# Patient Record
Sex: Male | Born: 1970 | Race: White | Hispanic: No | Marital: Married | Smoking: Former smoker
Health system: Southern US, Community
[De-identification: ages and names within clinical notes are randomized; demographics above are authoritative.]

## PROBLEM LIST (undated history)

## (undated) DIAGNOSIS — K859 Acute pancreatitis without necrosis or infection, unspecified: Secondary | ICD-10-CM

## (undated) DIAGNOSIS — E119 Type 2 diabetes mellitus without complications: Secondary | ICD-10-CM

## (undated) HISTORY — PX: CHOLECYSTECTOMY: SHX55

---

## 2008-12-27 ENCOUNTER — Emergency Department (HOSPITAL_COMMUNITY): Admission: EM | Admit: 2008-12-27 | Discharge: 2008-12-27 | Payer: Self-pay | Admitting: Emergency Medicine

## 2010-05-10 ENCOUNTER — Emergency Department (HOSPITAL_COMMUNITY): Admission: EM | Admit: 2010-05-10 | Discharge: 2010-05-11 | Payer: Self-pay | Admitting: Emergency Medicine

## 2010-11-11 LAB — URINALYSIS, ROUTINE W REFLEX MICROSCOPIC
Glucose, UA: NEGATIVE mg/dL
Nitrite: POSITIVE — AB
Protein, ur: 100 mg/dL — AB
Urobilinogen, UA: 0.2 mg/dL (ref 0.0–1.0)

## 2010-11-11 LAB — DIFFERENTIAL
Basophils Absolute: 0 10*3/uL (ref 0.0–0.1)
Eosinophils Absolute: 0.2 10*3/uL (ref 0.0–0.7)
Lymphocytes Relative: 16 % (ref 12–46)
Lymphs Abs: 1.4 10*3/uL (ref 0.7–4.0)
Neutrophils Relative %: 74 % (ref 43–77)

## 2010-11-11 LAB — BASIC METABOLIC PANEL
BUN: 9 mg/dL (ref 6–23)
Calcium: 8.9 mg/dL (ref 8.4–10.5)
Creatinine, Ser: 1.06 mg/dL (ref 0.4–1.5)
GFR calc Af Amer: >60 mL/min (ref >60)

## 2010-11-11 LAB — URINE CULTURE: Colony Count: >=100000

## 2010-11-11 LAB — URINE MICROSCOPIC-ADD ON

## 2010-11-11 LAB — CBC
Platelets: 292 10*3/uL (ref 150–400)
RBC: 4.8 MIL/uL (ref 4.22–5.81)
RDW: 14.5 % (ref 11.5–15.5)
WBC: 8.9 10*3/uL (ref 4.0–10.5)

## 2010-12-07 LAB — URINALYSIS, ROUTINE W REFLEX MICROSCOPIC
Glucose, UA: 100 mg/dL — AB
Ketones, ur: NEGATIVE mg/dL
Leukocytes, UA: NEGATIVE
Nitrite: NEGATIVE
Protein, ur: NEGATIVE mg/dL
pH: 5 (ref 5.0–8.0)

## 2010-12-07 LAB — CBC
MCHC: 34.3 g/dL (ref 30.0–36.0)
MCV: 86.3 fL (ref 78.0–100.0)
Platelets: 282 10*3/uL (ref 150–400)
RBC: 4.93 MIL/uL (ref 4.22–5.81)
RDW: 14.2 % (ref 11.5–15.5)

## 2010-12-07 LAB — URINE MICROSCOPIC-ADD ON

## 2010-12-07 LAB — DIFFERENTIAL
Basophils Absolute: 0 10*3/uL (ref 0.0–0.1)
Basophils Relative: 0 % (ref 0–1)
Eosinophils Absolute: 0.2 10*3/uL (ref 0.0–0.7)
Monocytes Relative: 8 % (ref 3–12)
Neutro Abs: 4.6 10*3/uL (ref 1.7–7.7)
Neutrophils Relative %: 64 % (ref 43–77)

## 2010-12-07 LAB — URINE CULTURE: Colony Count: 30000

## 2010-12-07 LAB — BASIC METABOLIC PANEL
BUN: 8 mg/dL (ref 6–23)
CO2: 22 mEq/L (ref 19–32)
Calcium: 8.8 mg/dL (ref 8.4–10.5)
Chloride: 108 mEq/L (ref 96–112)
Creatinine, Ser: 0.93 mg/dL (ref 0.4–1.5)
GFR calc Af Amer: >60 mL/min (ref >60)

## 2015-11-17 ENCOUNTER — Emergency Department (HOSPITAL_COMMUNITY)
Admission: EM | Admit: 2015-11-17 | Discharge: 2015-11-17 | Disposition: A | Discharge status: Home / Self Care | Payer: BLUE CROSS/BLUE SHIELD | Attending: Emergency Medicine | Admitting: Emergency Medicine

## 2015-11-17 ENCOUNTER — Encounter (HOSPITAL_COMMUNITY): Payer: Self-pay

## 2015-11-17 DIAGNOSIS — R509 Fever, unspecified: Secondary | ICD-10-CM | POA: Diagnosis present

## 2015-11-17 DIAGNOSIS — F17290 Nicotine dependence, other tobacco product, uncomplicated: Secondary | ICD-10-CM | POA: Insufficient documentation

## 2015-11-17 DIAGNOSIS — J111 Influenza due to unidentified influenza virus with other respiratory manifestations: Secondary | ICD-10-CM | POA: Diagnosis not present

## 2015-11-17 MED ORDER — IBUPROFEN 800 MG PO TABS
800.0000 mg | ORAL_TABLET | Freq: Once | ORAL | Status: AC
  Administered 2015-11-17: 800 mg via ORAL
  Filled 2015-11-17: qty 1

## 2015-11-17 NOTE — Discharge Instructions (Signed)

## 2015-11-17 NOTE — ED Provider Notes (Signed)
CSN: [id number]     Arrival date & time 11/17/15  [id number] History   First MD Initiated Contact with Patient 11/17/15 0957     Chief Complaint  Patient presents with  . Fever  . Generalized Body Aches     (Consider location/radiation/quality/duration/timing/severity/associated sxs/prior Treatment) Patient is a 45 y.o. male presenting with fever. The history is provided by the patient. No language interpreter was used.  Fever Max temp prior to arrival:  102 Temp source:  Oral Onset quality:  Gradual Duration:  3 days Timing:  Constant Progression:  Worsening Chronicity:  New Relieved by:  Nothing Worsened by:  Nothing tried Ineffective treatments:  None tried Associated symptoms: no ear pain and no rhinorrhea   Risk factors: no hx of cancer     History reviewed. No pertinent past medical history. Past Surgical History  Procedure Laterality Date  . Cholecystectomy     No family history on file. Social History  Substance Use Topics  . Smoking status: Former Games developermoker  . Smokeless tobacco: Current User    Types: Snuff  . Alcohol Use: No    Review of Systems  Constitutional: Positive for fever.  HENT: Negative for ear pain and rhinorrhea.   All other systems reviewed and are negative.     Allergies  Review of patient's allergies indicates no known allergies.  Home Medications   Prior to Admission medications   Not on File   BP 144/82 mmHg  Pulse 123  Temp(Src) 101.2 F (38.4 C) (Oral)  Resp 20  Ht 5\' 8"  (1.727 m)  Wt 144.697 kg  BMI 48.51 kg/m2  SpO2 97% Physical Exam  Constitutional: He is oriented to person, place, and time. He appears well-developed and well-nourished.  HENT:  Head: Normocephalic.  Right Ear: External ear normal.  Left Ear: External ear normal.  Nose: Nose normal.  Mouth/Throat: Oropharynx is clear and moist.  Eyes: Conjunctivae and EOM are normal. Pupils are equal, round, and reactive to light.  Neck: Normal range of motion.   Cardiovascular: Normal rate and normal heart sounds.   Pulmonary/Chest: Effort normal and breath sounds normal.  Abdominal: Soft. He exhibits no distension.  Musculoskeletal: Normal range of motion.  Neurological: He is alert and oriented to person, place, and time.  Skin: Skin is warm.  Psychiatric: He has a normal mood and affect.  Nursing note and vitals reviewed.   ED Course  Procedures (including critical care time) Labs Review Labs Reviewed - No data to display  Imaging Review No results found. I have personally reviewed and evaluated these images and lab results as part of my medical decision-making.   EKG Interpretation None      MDM   Final diagnoses:  Influenza    Pt counseled on fever management An After Visit Summary was printed and given to the patient.    Elson AreasLeslie K Sofia, PA-C 11/17/15 1601  Lonia SkinnerLeslie K DublinSofia, PA-C 11/17/15 1601  Gerhard Munchobert Lockwood, MD 11/17/15 [phone]

## 2015-11-17 NOTE — ED Notes (Signed)
Pt reports fever, cough, body aches x 3 days.  Reports was recently exposed to someone with flu.

## 2019-08-26 ENCOUNTER — Ambulatory Visit: Payer: Commercial Managed Care - PPO

## 2019-08-26 ENCOUNTER — Other Ambulatory Visit: Payer: Self-pay

## 2019-08-26 DIAGNOSIS — Z20822 Contact with and (suspected) exposure to covid-19: Secondary | ICD-10-CM

## 2019-08-27 ENCOUNTER — Encounter (HOSPITAL_COMMUNITY): Payer: Self-pay | Admitting: Emergency Medicine

## 2019-08-27 ENCOUNTER — Emergency Department (HOSPITAL_COMMUNITY): Payer: Commercial Managed Care - PPO

## 2019-08-27 ENCOUNTER — Inpatient Hospital Stay (HOSPITAL_COMMUNITY)
Admission: EM | Admit: 2019-08-27 | Discharge: 2019-09-17 | DRG: 177 | Disposition: A | Discharge status: Home / Self Care | Payer: Commercial Managed Care - PPO | Attending: Family Medicine | Admitting: Family Medicine

## 2019-08-27 ENCOUNTER — Other Ambulatory Visit: Payer: Self-pay

## 2019-08-27 DIAGNOSIS — Z833 Family history of diabetes mellitus: Secondary | ICD-10-CM

## 2019-08-27 DIAGNOSIS — E876 Hypokalemia: Secondary | ICD-10-CM | POA: Diagnosis not present

## 2019-08-27 DIAGNOSIS — Z720 Tobacco use: Secondary | ICD-10-CM | POA: Diagnosis not present

## 2019-08-27 DIAGNOSIS — Z6841 Body Mass Index (BMI) 40.0 and over, adult: Secondary | ICD-10-CM | POA: Diagnosis not present

## 2019-08-27 DIAGNOSIS — E1165 Type 2 diabetes mellitus with hyperglycemia: Secondary | ICD-10-CM | POA: Diagnosis not present

## 2019-08-27 DIAGNOSIS — Z86718 Personal history of other venous thrombosis and embolism: Secondary | ICD-10-CM

## 2019-08-27 DIAGNOSIS — D696 Thrombocytopenia, unspecified: Secondary | ICD-10-CM | POA: Diagnosis not present

## 2019-08-27 DIAGNOSIS — Z9981 Dependence on supplemental oxygen: Secondary | ICD-10-CM | POA: Diagnosis not present

## 2019-08-27 DIAGNOSIS — Z9049 Acquired absence of other specified parts of digestive tract: Secondary | ICD-10-CM

## 2019-08-27 DIAGNOSIS — J9601 Acute respiratory failure with hypoxia: Secondary | ICD-10-CM | POA: Diagnosis present

## 2019-08-27 DIAGNOSIS — U071 COVID-19: Principal | ICD-10-CM

## 2019-08-27 DIAGNOSIS — E871 Hypo-osmolality and hyponatremia: Secondary | ICD-10-CM | POA: Diagnosis present

## 2019-08-27 DIAGNOSIS — J1289 Other viral pneumonia: Secondary | ICD-10-CM | POA: Diagnosis not present

## 2019-08-27 DIAGNOSIS — E119 Type 2 diabetes mellitus without complications: Secondary | ICD-10-CM | POA: Diagnosis not present

## 2019-08-27 DIAGNOSIS — Z6838 Body mass index (BMI) 38.0-38.9, adult: Secondary | ICD-10-CM | POA: Diagnosis not present

## 2019-08-27 DIAGNOSIS — Z86711 Personal history of pulmonary embolism: Secondary | ICD-10-CM | POA: Diagnosis not present

## 2019-08-27 DIAGNOSIS — J1282 Pneumonia due to coronavirus disease 2019: Secondary | ICD-10-CM | POA: Diagnosis present

## 2019-08-27 DIAGNOSIS — M7989 Other specified soft tissue disorders: Secondary | ICD-10-CM | POA: Diagnosis not present

## 2019-08-27 DIAGNOSIS — E11649 Type 2 diabetes mellitus with hypoglycemia without coma: Secondary | ICD-10-CM | POA: Diagnosis not present

## 2019-08-27 DIAGNOSIS — F17290 Nicotine dependence, other tobacco product, uncomplicated: Secondary | ICD-10-CM | POA: Diagnosis not present

## 2019-08-27 DIAGNOSIS — R7989 Other specified abnormal findings of blood chemistry: Secondary | ICD-10-CM | POA: Diagnosis not present

## 2019-08-27 DIAGNOSIS — R0602 Shortness of breath: Secondary | ICD-10-CM | POA: Diagnosis present

## 2019-08-27 HISTORY — DX: Acute pancreatitis without necrosis or infection, unspecified: K85.90

## 2019-08-27 HISTORY — DX: Type 2 diabetes mellitus without complications: E11.9

## 2019-08-27 LAB — BASIC METABOLIC PANEL
Anion gap: 15 (ref 5–15)
BUN: 12 mg/dL (ref 6–20)
CO2: 15 mmol/L — ABNORMAL LOW (ref 22–32)
Calcium: 9.1 mg/dL (ref 8.9–10.3)
Chloride: 100 mmol/L (ref 98–111)
Creatinine, Ser: 0.78 mg/dL (ref 0.61–1.24)
GFR calc Af Amer: >60 mL/min (ref >60)
GFR calc non Af Amer: >60 mL/min (ref >60)
Glucose, Bld: 301 mg/dL — ABNORMAL HIGH (ref 70–99)
Potassium: 3.1 mmol/L — ABNORMAL LOW (ref 3.5–5.1)
Sodium: 130 mmol/L — ABNORMAL LOW (ref 135–145)

## 2019-08-27 LAB — TRIGLYCERIDES: Triglycerides: 108 mg/dL (ref <150)

## 2019-08-27 LAB — FERRITIN: Ferritin: 457 ng/mL — ABNORMAL HIGH (ref 24–336)

## 2019-08-27 LAB — C-REACTIVE PROTEIN: CRP: 12.6 mg/dL — ABNORMAL HIGH (ref <1.0)

## 2019-08-27 LAB — TROPONIN I (HIGH SENSITIVITY)
Troponin I (High Sensitivity): 3 ng/L (ref <18)
Troponin I (High Sensitivity): 3 ng/L (ref <18)

## 2019-08-27 LAB — CBC
HCT: 44.3 % (ref 39.0–52.0)
Hemoglobin: 15.6 g/dL (ref 13.0–17.0)
MCH: 29.7 pg (ref 26.0–34.0)
MCHC: 35.2 g/dL (ref 30.0–36.0)
MCV: 84.4 fL (ref 80.0–100.0)
Platelets: 193 10*3/uL (ref 150–400)
RBC: 5.25 MIL/uL (ref 4.22–5.81)
RDW: 12.5 % (ref 11.5–15.5)
WBC: 4 10*3/uL (ref 4.0–10.5)
nRBC: 0 % (ref 0.0–0.2)

## 2019-08-27 LAB — NOVEL CORONAVIRUS, NAA: SARS-CoV-2, NAA: DETECTED — AB

## 2019-08-27 LAB — PROCALCITONIN: Procalcitonin: <0.1 ng/mL

## 2019-08-27 LAB — D-DIMER, QUANTITATIVE: D-Dimer, Quant: 0.54 ug/mL-FEU — ABNORMAL HIGH (ref 0.00–0.50)

## 2019-08-27 LAB — LACTATE DEHYDROGENASE: LDH: 303 U/L — ABNORMAL HIGH (ref 98–192)

## 2019-08-27 LAB — POC SARS CORONAVIRUS 2 AG -  ED: SARS Coronavirus 2 Ag: POSITIVE — AB

## 2019-08-27 LAB — FIBRINOGEN: Fibrinogen: 675 mg/dL — ABNORMAL HIGH (ref 210–475)

## 2019-08-27 LAB — LACTIC ACID, PLASMA: Lactic Acid, Venous: 1.3 mmol/L (ref 0.5–1.9)

## 2019-08-27 MED ORDER — DEXAMETHASONE SODIUM PHOSPHATE 10 MG/ML IJ SOLN
8.0000 mg | Freq: Once | INTRAMUSCULAR | Status: AC
  Administered 2019-08-27: 8 mg via INTRAVENOUS
  Filled 2019-08-27: qty 1

## 2019-08-27 MED ORDER — IOHEXOL 350 MG/ML SOLN
100.0000 mL | Freq: Once | INTRAVENOUS | Status: AC | PRN
  Administered 2019-08-27: 100 mL via INTRAVENOUS

## 2019-08-27 MED ORDER — SODIUM CHLORIDE 0.9 % IV BOLUS
500.0000 mL | Freq: Once | INTRAVENOUS | Status: AC
  Administered 2019-08-27: 500 mL via INTRAVENOUS

## 2019-08-27 NOTE — ED Provider Notes (Signed)
Medical screening examination/treatment/procedure(s) were conducted as a shared visit with non-physician practitioner(s) and myself.  I personally evaluated the patient during the encounter.  Clinical Impression:   Final diagnoses:  COVID-19  Acute respiratory failure with hypoxia [facility])    The patient is an ill-appearing 48 year old male, history of being tested for coronavirus 2 days ago, tested positive, within the last 12 hours he has had a rapid decline with severe shortness of breath, some discomfort in his chest, he has felt severely dyspneic and found his oxygen at home to be 86%, he could get up to 90 when he would stand.  That being said on my exam the patient is tachypneic, speaking in very short and sentences, has a rapid tachypnea, has abnormal lung sounds with mild wheezing but also the presence of rales.  He has no peripheral edema, no asymmetry of the legs, he is tachycardic to 120 and hypoxic to 89% on 6 L by nasal cannula.  He is in severe respiratory distress, he will need to be admitted to the hospital, this may be Covid pneumonia but could also be pulmonary embolism.  CT scan pending.   Henry Miller was evaluated in Emergency Department on 08/27/2019 for the symptoms described in the history of present illness. He was evaluated in the context of the global COVID-19 pandemic, which necessitated consideration that the patient might be at risk for infection with the SARS-CoV-2 virus that causes COVID-19. Institutional protocols and algorithms that pertain to the evaluation of patients at risk for COVID-19 are in a state of rapid change based on information released by regulatory bodies including the CDC and federal and state organizations. These policies and algorithms were followed during the patient's care in the ED.    Noemi Chapel, MD 08/27/19 [phone]

## 2019-08-27 NOTE — Progress Notes (Signed)
Placed patient on 15L salter HFNC. Patient's 02 saturations increased to 92% and patient said he felt a little better.

## 2019-08-27 NOTE — ED Triage Notes (Signed)
Patient here with shortness of breath and tested for COVID on Sunday and was positive. Patient's sats are 84 on room air. Patient on 4 liters of oxygen in triage and is still at 89 percent.

## 2019-08-27 NOTE — H&P (Signed)
History and Physical    Henry Miller CBS:[id number] DOB: [birth year] DOA: 08/27/2019  PCP: Patient, No Pcp Per   Patient coming from: Home.  I have personally briefly reviewed patient's old medical records in [facility]  Chief Complaint: Shortness of breath.  HPI: Henry Miller is a 48 y.o. male with medical history significant of morbid obesity, prediabetes who is coming to the emergency department with progressively worse dyspnea for the past few days.  He was tested on Sunday and he was positive for COVID-19.  He initially arrived on 4 L of oxygen, but O2 sat was only 89%.  He is currently on 10 LPM via HFNC.  He has been having a dry cough, which is occasionally productive.  No fever, but positive chills and night sweats.  He denies nausea or vomiting, but has been having diarrhea.  No chest pain, palpitations, dizziness, PND or recent lower extremity pitting edema.  No dysuria, frequency hematuria.  No polyuria, polydipsia, polyphagia or blurred vision.  ED Course: Initial vital signs temperature 98.7 F, pulse 124, respirations 22, blood pressure 133/94 mmHg and O2 sat 89% on room air.  The patient received dexamethasone 8 mg IVP and a 500 mL bolus.  His CBC was normal.  SARS coronavirus antigen was positive.  Fibrinogen 675, D-dimer 0.54.  Triglycerides were 108.  CRP 12.6, ferritin 457, lactic acid was normal.  Procalcitonin was less than 0.10.  Sodium 130, potassium 3.1, chloride 100 and CO2 15 mmol/L.  Glucose is 301 mg/dL.  Renal function and calcium were normal.  Troponin was normal.  BNP was 21.0 pg/mL.  Imaging is consistent with bilateral pneumonia.  Review of Systems: As per HPI otherwise 10 point review of systems negative.    History reviewed. No pertinent past medical history.  Past Surgical History:  Procedure Laterality Date  . CHOLECYSTECTOMY       reports that he has quit smoking. His smokeless tobacco use includes snuff. He reports that he does not drink  alcohol or use drugs.  No Known Allergies  Family medical history. His father and his maternal grandmother had diabetes.  Prior to Admission medications   Medication Sig Start Date End Date Taking? Authorizing Provider  bismuth subsalicylate (PEPTO BISMOL) 262 MG chewable tablet Chew 524 mg by mouth once as needed for diarrhea or loose stools.   Yes [provider]  Ibuprofen-diphenhydrAMINE Cit (ADVIL PM) 200-38 MG TABS Take 1 tablet by mouth daily as needed (FOR PAIN/SLEEP).   Yes [provider]    Physical Exam: Vitals:   08/27/19 2245 08/27/19 2300 08/27/19 2315 08/27/19 2330  BP:      Pulse: (!) 127 (!) 112 (!) 108 (!) 107  Resp: (!) 29 (!) 38 (!) 42 (!) 30  Temp:      TempSrc:      SpO2: (!) 84% (!) 87% 93% 92%  Weight:      Height:        Constitutional: NAD, calm, comfortable Eyes: PERRL, lids and conjunctivae normal ENMT: Mucous membranes are moist. Posterior pharynx clear of any exudate or lesions.  Neck: normal, supple, no masses, no thyromegaly Respiratory: Tachypneic with bilateral crackles, no wheezing or rhonchi. No accessory muscle use.  Cardiovascular: Regular rate and rhythm, no murmurs / rubs / gallops. No extremity edema. 2+ pedal pulses. No carotid bruits.  Abdomen: Obese, nondistended.  Soft, no tenderness, no masses palpated. No hepatosplenomegaly. Bowel sounds positive.  Musculoskeletal: no clubbing / cyanosis.  Good  ROM, no contractures. Normal muscle tone.  Skin: no rashes, lesions, ulcers on limited dermatological examination. Neurologic: CN 2-12 grossly intact. Sensation intact, DTR normal. Strength 5/5 in all 4.  Psychiatric: Normal judgment and insight. Alert and oriented x 3. Normal mood.   Labs on Admission: I have personally reviewed following labs and imaging studies  CBC: Recent Labs  Lab 08/27/19 2030  WBC 4.0  HGB 15.6  HCT 44.3  MCV 84.4  PLT 193   Basic Metabolic Panel: Recent Labs  Lab 08/27/19 2030  NA  130*  K 3.1*  CL 100  CO2 15*  GLUCOSE 301*  BUN 12  CREATININE 0.78  CALCIUM 9.1   GFR: Estimated Creatinine Clearance: 157.1 mL/min (by C-G formula based on SCr of 0.78 mg/dL). Liver Function Tests: No results for input(s): AST, ALT, ALKPHOS, BILITOT, PROT, ALBUMIN in the last 168 hours. No results for input(s): LIPASE, AMYLASE in the last 168 hours. No results for input(s): AMMONIA in the last 168 hours. Coagulation Profile: No results for input(s): INR, PROTIME in the last 168 hours. Cardiac Enzymes: No results for input(s): CKTOTAL, CKMB, CKMBINDEX, TROPONINI in the last 168 hours. BNP (last 3 results) No results for input(s): PROBNP in the last 8760 hours. HbA1C: No results for input(s): HGBA1C in the last 72 hours. CBG: No results for input(s): GLUCAP in the last 168 hours. Lipid Profile: Recent Labs    08/27/19 2030  TRIG 108   Thyroid Function Tests: No results for input(s): TSH, T4TOTAL, FREET4, T3FREE, THYROIDAB in the last 72 hours. Anemia Panel: Recent Labs    08/27/19 2030  FERRITIN 457*   Urine analysis:    Component Value Date/Time   COLORURINE YELLOW 05/10/2010 2326   APPEARANCEUR HAZY (A) 05/10/2010 2326   LABSPEC 1.025 05/10/2010 2326   PHURINE 6.0 05/10/2010 2326   GLUCOSEU NEGATIVE 05/10/2010 2326   HGBUR LARGE (A) 05/10/2010 2326   BILIRUBINUR NEGATIVE 05/10/2010 2326   KETONESUR NEGATIVE 05/10/2010 2326   PROTEINUR 100 (A) 05/10/2010 2326   UROBILINOGEN 0.2 05/10/2010 2326   NITRITE POSITIVE (A) 05/10/2010 2326   LEUKOCYTESUR MODERATE (A) 05/10/2010 2326    Radiological Exams on Admission: CT Angio Chest PE W and/or Wo Contrast  Result Date: 08/27/2019 CLINICAL DATA:  Shortness of breath EXAM: CT ANGIOGRAPHY CHEST WITH CONTRAST TECHNIQUE: Multidetector CT imaging of the chest was performed using the standard protocol during bolus administration of intravenous contrast. Multiplanar CT image reconstructions and MIPs were obtained to  evaluate the vascular anatomy. CONTRAST:  100mL OMNIPAQUE IOHEXOL 350 MG/ML SOLN COMPARISON:  None. FINDINGS: Cardiovascular: Evaluation for pulmonary emboli is limited by significant respiratory motion artifact.Given this limitation, there is no significant pulmonary embolism centrally. The main pulmonary artery is within normal limits for size. There is no CT evidence of acute right heart strain. The visualized aorta is normal. Heart size is normal, without pericardial effusion. Mediastinum/Nodes: --No mediastinal or hilar lymphadenopathy. --No axillary lymphadenopathy. --No supraclavicular lymphadenopathy. --Normal thyroid gland. --The esophagus is unremarkable Lungs/Pleura: There are diffuse bilateral ground-glass airspace opacities. There is no pneumothorax or large pleural effusion. The trachea is unremarkable. Upper Abdomen: No acute abnormality. Musculoskeletal: No chest wall abnormality. No acute or significant osseous findings. Bilateral gynecomastia is noted. Review of the MIP images confirms the above findings. IMPRESSION: 1. Evaluation for pulmonary emboli is limited by significant respiratory motion artifact. Given this limitation, there is no significant pulmonary embolism centrally. 2. Diffuse bilateral ground-glass airspace opacities, consistent with the patient's history of viral pneumonia.  Electronically Signed   By: Constance Holster M.D.   On: 08/27/2019 23:09   DG Chest Portable 1 View  Result Date: 08/27/2019 CLINICAL DATA:  Shortness of breath history of positive COVID test EXAM: PORTABLE CHEST 1 VIEW COMPARISON:  None. FINDINGS: Low lung volumes. Patchy airspace opacities throughout the right lung and at the left base. Heart size upper normal, likely augmented by low lung volume. Negative for pneumothorax. IMPRESSION: Patchy bilateral airspace opacities, suspect for pneumonia. Electronically Signed   By: Donavan Foil M.D.   On: 08/27/2019 20:54    EKG: Independently reviewed.    Vent. rate 108 BPM PR interval * ms QRS duration 94 ms QT/QTc 350/470 ms P-R-T axes 36 18 13 Sinus tachycardia Otherwise within normal limits.  Assessment/Plan Principal Problem:   Pneumonia due to COVID-19 virus Admit to progressive unit/inpatient. Continue supplemental oxygen. Continue bronchodilators. Antitussives as needed. Antiemetics as needed. Remdesivir per pharmacy. Actemra per pharmacy. Continue glucocorticoids. Follow-up CBC, CMP inflammatory markers daily.  Active Problems:   Hypokalemia Replacing. Follow-up potassium level    Type 2 diabetes mellitus ([place]) Per patient, he was diagnosed with prediabetes. He is not on medications or any specific treatment for it. Carbohydrate modified diet. CBG monitoring every 4 hours with RI SS.    Morbid obesity with BMI of 40.0-44.9, adult ([place]) Needs significant lifestyle modifications.   DVT prophylaxis: Lovenox SQ. Code Status: Full code. Family Communication:  Disposition Plan: Admit to [facility] for COVID-19 pneumonia treatment. Consults called: Admission status: Inpatient/progressive.   Reubin Milan MD [facility]  If 7PM-7AM, please contact night-coverage [email or web address]  08/27/2019, 11:58 PM   This document was prepared using Dragon voice recognition software and may contain some unintended transcription errors.

## 2019-08-27 NOTE — ED Provider Notes (Signed)
[facility] EMERGENCY DEPARTMENT Provider Note   CSN: [id number] Arrival date & time: 08/27/19  1930    History Chief Complaint  Patient presents with  . Shortness of Breath   Henry Miller is a 48 y.o. male with past medical history who presents for evaluation of shortness of breath.  Patient diagnosed with Covid on Sunday.  Patient states he has had some generalized shortness of breath however this morning he became extremely dyspneic.  Has not been having a fever however has had cough productive of clear sputum.  Prior history of PE, DVT.  No fever, chills, nausea, vomiting, chest pain, hemoptysis, unilateral weakness.  Denies additional aggravating or alleviating factors.  Has not been taking thing for symptoms.  History obtained from patient and past medical records.  No interpreter is used.  HPI     History reviewed. No pertinent past medical history.  There are no problems to display for this patient.   Past Surgical History:  Procedure Laterality Date  . CHOLECYSTECTOMY         History reviewed. No pertinent family history.  Social History   Tobacco Use  . Smoking status: Former Research scientist (life sciences)  . Smokeless tobacco: Current User    Types: Snuff  Substance Use Topics  . Alcohol use: No  . Drug use: No    Home Medications Prior to Admission medications   Medication Sig Start Date End Date Taking? Authorizing Provider  bismuth subsalicylate (PEPTO BISMOL) 262 MG chewable tablet Chew 524 mg by mouth once as needed for diarrhea or loose stools.   Yes [provider]  Ibuprofen-diphenhydrAMINE Cit (ADVIL PM) 200-38 MG TABS Take 1 tablet by mouth daily as needed (FOR PAIN/SLEEP).   Yes [provider]    Allergies    Patient has no known allergies.  Review of Systems   Review of Systems  Constitutional: Positive for activity change, appetite change, chills and fatigue.  HENT: Negative.   Respiratory: Positive for cough and shortness of breath.  Negative for apnea, choking, chest tightness and wheezing.   Cardiovascular: Negative.   Gastrointestinal: Negative.   Genitourinary: Negative.   Musculoskeletal: Negative.   Skin: Negative.   Neurological: Negative.   All other systems reviewed and are negative.   Physical Exam Updated Vital Signs BP (!) 133/94 (BP Location: Left Arm)   Pulse (!) 124   Temp 98.7 F (37.1 C) (Oral)   Resp (!) 22   Ht '5\' 10"'  (1.778 m)   Wt 133.8 kg   SpO2 92%   BMI 42.33 kg/m   Physical Exam Vitals and nursing note reviewed.  Constitutional:      General: He is not in acute distress.    Appearance: He is well-developed. He is obese. He is not diaphoretic.     Comments: Appears in distress  HENT:     Head: Normocephalic and atraumatic.     Mouth/Throat:     Mouth: Mucous membranes are moist.  Eyes:     Pupils: Pupils are equal, round, and reactive to light.  Neck:     Trachea: Phonation normal.  Cardiovascular:     Rate and Rhythm: Regular rhythm. Tachycardia present.     Pulses:          Radial pulses are 2+ on the right side and 2+ on the left side.       Dorsalis pedis pulses are 2+ on the right side and 2+ on the left side.  Posterior tibial pulses are 2+ on the right side and 2+ on the left side.  Pulmonary:     Effort: Tachypnea, accessory muscle usage, prolonged expiration and respiratory distress present.     Comments: Clear lung sounds however tachypneic Abdominal:     General: Bowel sounds are normal. There is no distension.     Palpations: Abdomen is soft.     Tenderness: There is no abdominal tenderness.  Musculoskeletal:        General: Normal range of motion.     Cervical back: Full passive range of motion without pain, normal range of motion and neck supple.     Comments: Moves all 4 extremities without difficulty.  Calves nontender bilaterally.  Bevelyn Buckles' sign negative  Skin:    General: Skin is warm and dry.     Capillary Refill: Capillary refill takes less  than 2 seconds.     Comments: Brisk cap refill.  No edema, erythema or warmth.  Neurological:     Mental Status: He is alert.    ED Results / Procedures / Treatments   Labs (all labs ordered are listed, but only abnormal results are displayed) Labs Reviewed  BASIC METABOLIC PANEL - Abnormal; Notable for the following components:      Result Value   Sodium 130 (*)    Potassium 3.1 (*)    CO2 15 (*)    Glucose, Bld 301 (*)    All other components within normal limits  D-DIMER, QUANTITATIVE (NOT AT [place]) - Abnormal; Notable for the following components:   D-Dimer, Quant 0.54 (*)    All other components within normal limits  LACTATE DEHYDROGENASE - Abnormal; Notable for the following components:   LDH 303 (*)    All other components within normal limits  FERRITIN - Abnormal; Notable for the following components:   Ferritin 457 (*)    All other components within normal limits  FIBRINOGEN - Abnormal; Notable for the following components:   Fibrinogen 675 (*)    All other components within normal limits  C-REACTIVE PROTEIN - Abnormal; Notable for the following components:   CRP 12.6 (*)    All other components within normal limits  POC SARS CORONAVIRUS 2 AG -  ED - Abnormal; Notable for the following components:   SARS Coronavirus 2 Ag POSITIVE (*)    All other components within normal limits  CULTURE, BLOOD (ROUTINE X 2)  CULTURE, BLOOD (ROUTINE X 2)  CBC  TRIGLYCERIDES  LACTIC ACID, PLASMA  LACTIC ACID, PLASMA  PROCALCITONIN  TROPONIN I (HIGH SENSITIVITY)  TROPONIN I (HIGH SENSITIVITY)    EKG None  Radiology DG Chest Portable 1 View  Result Date: 08/27/2019 CLINICAL DATA:  Shortness of breath history of positive COVID test EXAM: PORTABLE CHEST 1 VIEW COMPARISON:  None. FINDINGS: Low lung volumes. Patchy airspace opacities throughout the right lung and at the left base. Heart size upper normal, likely augmented by low lung volume. Negative for pneumothorax. IMPRESSION:  Patchy bilateral airspace opacities, suspect for pneumonia. Electronically Signed   By: Donavan Foil M.D.   On: 08/27/2019 20:54    Procedures .Critical Care Performed by: Nettie Elm, PA-C Authorized by: Nettie Elm, PA-C   Critical care provider statement:    Critical care time (minutes):  45   Critical care was necessary to treat or prevent imminent or life-threatening deterioration of the following conditions:  Respiratory failure (Acute hypoxic respiratory failure)   Critical care was time spent personally by me on  the following activities:  Discussions with consultants, evaluation of patient's response to treatment, examination of patient, ordering and performing treatments and interventions, ordering and review of laboratory studies, ordering and review of radiographic studies, pulse oximetry, re-evaluation of patient's condition, obtaining history from patient or surrogate and review of old charts   (including critical care time)  Medications Ordered in ED Medications  iohexol (OMNIPAQUE) 350 MG/ML injection 100 mL (has no administration in time range)  dexamethasone (DECADRON) injection 8 mg (8 mg Intravenous Given 08/27/19 2029)  sodium chloride 0.9 % bolus 500 mL (500 mLs Intravenous New Bag/Given 08/27/19 2020)    ED Course  I have reviewed the triage vital signs and the nursing notes.  Pertinent labs & imaging results that were available during my care of the patient were reviewed by me and considered in my medical decision making (see chart for details).   48 year old male, known Covid positive presents for shortness of breath.  Patient tachycardic, tachypneic, hypoxic requiring oxygen.  Patient appears critically ill.  84% on room air, patient placed on nasal cannula.  Only able to speak small words with severe dyspnea.  Patient substantial tachycardia concern for PE.  Will obtain Covid labs, CTA chest, EKG.  Patient will need admission for acute respiratory  failure with hypoxia.   Clinical Course as of Aug 26 2202  Tue Aug 27, 2019  2108 COVID positive  POC SARS Coronavirus 2 Ag-ED - Nasal Swab (BD Veritor Kit)(!) [BH]  2108 No leukocytosis, Hgb at baseline  CBC [BH]  2108 Patchy infiltrates consistent with COVID PNA  DG Chest Portable 1 View [BH]  2200 Elevated, patient is pending CTA chest  D-dimer, quantitative(!) [BH]  2201 Negative  Troponin I (High Sensitivity) [BH]  2201 Hyponatremia to 130, potassium 3.1, elevated glucose of 3 1, per chart review no prior history of diabetes.  Basic metabolic panel(!) [BH]    Clinical Course User Index [BH] Beya Tipps A, PA-C   Patient reassessed requiring 15 L non-rebreather and appear comfortable on this. Protecting airway. Do not think he requires intubation at this time.  Care transferred to [place], PA-C who will follow up on imaging. Patient will need admission for acute hypoxic respiratory failure likely secondary to Covid pneumonia.  Patient seen and evaluated by attending physician, Dr. Sabra Heck who agrees with above treatment, plan and disposition.  MDM Rules/Calculators/A&P                      Henry Miller was evaluated in Emergency Department on 08/27/2019 for the symptoms described in the history of present illness. He was evaluated in the context of the global COVID-19 pandemic, which necessitated consideration that the patient might be at risk for infection with the SARS-CoV-2 virus that causes COVID-19. Institutional protocols and algorithms that pertain to the evaluation of patients at risk for COVID-19 are in a state of rapid change based on information released by regulatory bodies including the CDC and federal and state organizations. These policies and algorithms were followed during the patient's care in the ED. Final Clinical Impression(s) / ED Diagnoses Final diagnoses:  COVID-19  Acute respiratory failure with hypoxia [facility])    Rx / DC Orders ED Discharge Orders     None       Hanaa Payes A, PA-C 08/27/19 2203    Noemi Chapel, MD 08/27/19 2319

## 2019-08-28 ENCOUNTER — Encounter (HOSPITAL_COMMUNITY): Payer: Self-pay | Admitting: Internal Medicine

## 2019-08-28 DIAGNOSIS — J9601 Acute respiratory failure with hypoxia: Secondary | ICD-10-CM

## 2019-08-28 DIAGNOSIS — E876 Hypokalemia: Secondary | ICD-10-CM

## 2019-08-28 DIAGNOSIS — E1165 Type 2 diabetes mellitus with hyperglycemia: Secondary | ICD-10-CM

## 2019-08-28 LAB — C-REACTIVE PROTEIN: CRP: 13.8 mg/dL — ABNORMAL HIGH (ref <1.0)

## 2019-08-28 LAB — HEPATIC FUNCTION PANEL
ALT: 33 U/L (ref 0–44)
AST: 36 U/L (ref 15–41)
Albumin: 3.2 g/dL — ABNORMAL LOW (ref 3.5–5.0)
Alkaline Phosphatase: 59 U/L (ref 38–126)
Bilirubin, Direct: <0.1 mg/dL (ref 0.0–0.2)
Total Bilirubin: 1 mg/dL (ref 0.3–1.2)
Total Protein: 7 g/dL (ref 6.5–8.1)

## 2019-08-28 LAB — CBG MONITORING, ED
Glucose-Capillary: 265 mg/dL — ABNORMAL HIGH (ref 70–99)
Glucose-Capillary: 275 mg/dL — ABNORMAL HIGH (ref 70–99)
Glucose-Capillary: 236 mg/dL — ABNORMAL HIGH (ref 70–99)
Glucose-Capillary: 286 mg/dL — ABNORMAL HIGH (ref 70–99)
Glucose-Capillary: 278 mg/dL — ABNORMAL HIGH (ref 70–99)
Glucose-Capillary: 266 mg/dL — ABNORMAL HIGH (ref 70–99)

## 2019-08-28 LAB — PHOSPHORUS: Phosphorus: 3 mg/dL (ref 2.5–4.6)

## 2019-08-28 LAB — BRAIN NATRIURETIC PEPTIDE: B Natriuretic Peptide: 21 pg/mL (ref 0.0–100.0)

## 2019-08-28 LAB — FERRITIN: Ferritin: 442 ng/mL — ABNORMAL HIGH (ref 24–336)

## 2019-08-28 LAB — HIV ANTIBODY (ROUTINE TESTING W REFLEX): HIV Screen 4th Generation wRfx: NONREACTIVE

## 2019-08-28 LAB — GLUCOSE, CAPILLARY: Glucose-Capillary: 263 mg/dL — ABNORMAL HIGH (ref 70–99)

## 2019-08-28 LAB — PROCALCITONIN: Procalcitonin: <0.1 ng/mL

## 2019-08-28 LAB — MAGNESIUM: Magnesium: 2 mg/dL (ref 1.7–2.4)

## 2019-08-28 MED ORDER — MORPHINE SULFATE (PF) 4 MG/ML IV SOLN
4.0000 mg | INTRAVENOUS | Status: AC | PRN
  Administered 2019-08-28: 2 mg via INTRAVENOUS
  Administered 2019-08-28: 4 mg via INTRAVENOUS
  Filled 2019-08-28 (×2): qty 1

## 2019-08-28 MED ORDER — INSULIN DETEMIR 100 UNIT/ML ~~LOC~~ SOLN
0.1000 [IU]/kg | Freq: Two times a day (BID) | SUBCUTANEOUS | Status: DC
  Administered 2019-08-28 – 2019-08-29 (×3): 13 [IU] via SUBCUTANEOUS
  Filled 2019-08-28 (×6): qty 0.13

## 2019-08-28 MED ORDER — ACETAMINOPHEN 650 MG RE SUPP: 650.0000 mg | Freq: Four times a day (QID) | RECTAL | Status: DC | PRN

## 2019-08-28 MED ORDER — ONDANSETRON HCL 4 MG/2ML IJ SOLN
4.0000 mg | Freq: Four times a day (QID) | INTRAMUSCULAR | Status: DC | PRN
  Administered 2019-08-28 (×2): 4 mg via INTRAVENOUS
  Filled 2019-08-28 (×2): qty 2

## 2019-08-28 MED ORDER — ENOXAPARIN SODIUM 80 MG/0.8ML ~~LOC~~ SOLN
65.0000 mg | SUBCUTANEOUS | Status: DC
  Administered 2019-08-28 – 2019-09-04 (×8): 65 mg via SUBCUTANEOUS
  Filled 2019-08-28 (×8): qty 0.8

## 2019-08-28 MED ORDER — IPRATROPIUM-ALBUTEROL 20-100 MCG/ACT IN AERS
2.0000 | INHALATION_SPRAY | Freq: Four times a day (QID) | RESPIRATORY_TRACT | Status: DC
  Administered 2019-08-28 (×3): 2 via RESPIRATORY_TRACT
  Filled 2019-08-28 (×2): qty 4

## 2019-08-28 MED ORDER — ZINC SULFATE 220 (50 ZN) MG PO CAPS
220.0000 mg | ORAL_CAPSULE | Freq: Every day | ORAL | Status: DC
  Administered 2019-08-28 – 2019-09-17 (×21): 220 mg via ORAL
  Filled 2019-08-28 (×21): qty 1

## 2019-08-28 MED ORDER — METHYLPREDNISOLONE SODIUM SUCC 125 MG IJ SOLR
70.0000 mg | Freq: Two times a day (BID) | INTRAMUSCULAR | Status: DC
  Administered 2019-08-28 (×2): 70 mg via INTRAVENOUS
  Filled 2019-08-28 (×2): qty 2

## 2019-08-28 MED ORDER — ASCORBIC ACID 500 MG PO TABS
500.0000 mg | ORAL_TABLET | Freq: Every day | ORAL | Status: DC
  Administered 2019-08-28 – 2019-09-17 (×21): 500 mg via ORAL
  Filled 2019-08-28 (×21): qty 1

## 2019-08-28 MED ORDER — SODIUM CHLORIDE 0.9 % IV SOLN
200.0000 mg | Freq: Once | INTRAVENOUS | Status: AC
  Administered 2019-08-28: 04:00:00 200 mg via INTRAVENOUS
  Filled 2019-08-28: qty 40

## 2019-08-28 MED ORDER — MORPHINE SULFATE (PF) 2 MG/ML IV SOLN
2.0000 mg | Freq: Once | INTRAVENOUS | Status: AC
  Administered 2019-08-28: 15:00:00 2 mg via INTRAVENOUS

## 2019-08-28 MED ORDER — POTASSIUM CHLORIDE CRYS ER 20 MEQ PO TBCR
40.0000 meq | EXTENDED_RELEASE_TABLET | Freq: Once | ORAL | Status: AC
  Administered 2019-08-28: 40 meq via ORAL
  Filled 2019-08-28: qty 2

## 2019-08-28 MED ORDER — GUAIFENESIN-DM 100-10 MG/5ML PO SYRP
10.0000 mL | ORAL_SOLUTION | ORAL | Status: DC | PRN
  Administered 2019-08-28 – 2019-09-17 (×18): 10 mL via ORAL
  Filled 2019-08-28 (×18): qty 10

## 2019-08-28 MED ORDER — POTASSIUM CHLORIDE IN NACL 40-0.9 MEQ/L-% IV SOLN
INTRAVENOUS | Status: AC
  Administered 2019-08-28: 250 mL/h via INTRAVENOUS
  Filled 2019-08-28: qty 1000

## 2019-08-28 MED ORDER — ACETAMINOPHEN 325 MG PO TABS
650.0000 mg | ORAL_TABLET | Freq: Four times a day (QID) | ORAL | Status: DC | PRN
  Administered 2019-08-30 – 2019-09-14 (×5): 650 mg via ORAL
  Filled 2019-08-28 (×5): qty 2

## 2019-08-28 MED ORDER — INSULIN ASPART 100 UNIT/ML ~~LOC~~ SOLN
0.0000 [IU] | SUBCUTANEOUS | Status: DC
  Administered 2019-08-28: 09:00:00 7 [IU] via SUBCUTANEOUS
  Administered 2019-08-28 (×5): 11 [IU] via SUBCUTANEOUS
  Administered 2019-08-29: 7 [IU] via SUBCUTANEOUS
  Filled 2019-08-28 (×4): qty 1

## 2019-08-28 MED ORDER — SODIUM CHLORIDE 0.9 % IV SOLN
100.0000 mg | Freq: Every day | INTRAVENOUS | Status: AC
  Administered 2019-08-29 – 2019-09-01 (×4): 100 mg via INTRAVENOUS
  Filled 2019-08-28 (×5): qty 20

## 2019-08-28 MED ORDER — ONDANSETRON HCL 4 MG PO TABS: 4.0000 mg | ORAL_TABLET | Freq: Four times a day (QID) | ORAL | Status: DC | PRN

## 2019-08-28 MED ORDER — TOCILIZUMAB 400 MG/20ML IV SOLN
800.0000 mg | Freq: Once | INTRAVENOUS | Status: AC
  Administered 2019-08-28: 10:00:00 800 mg via INTRAVENOUS
  Filled 2019-08-28: qty 40

## 2019-08-28 MED ORDER — HYDROCOD POLST-CPM POLST ER 10-8 MG/5ML PO SUER
5.0000 mL | Freq: Two times a day (BID) | ORAL | Status: DC | PRN
  Administered 2019-08-28 – 2019-09-16 (×20): 5 mL via ORAL
  Filled 2019-08-28 (×20): qty 5

## 2019-08-28 MED ORDER — INSULIN DETEMIR 100 UNIT/ML ~~LOC~~ SOLN
SUBCUTANEOUS | Status: AC
  Filled 2019-08-28: qty 1

## 2019-08-28 MED ORDER — ENOXAPARIN SODIUM 40 MG/0.4ML ~~LOC~~ SOLN: 40.0000 mg | SUBCUTANEOUS | Status: DC

## 2019-08-28 NOTE — ED Notes (Signed)
Dr Tat remains at bedside. Dr Tat explaining to patient plan to intubate if O2 sats continue to drop, pt verbalizes understanding. Pt states he feels a little better today than he did last night. Informed Dr Tat pt requesting morphine and zofran. Per Dr Tat, give Morphine 2mg  then in 15 minutes give additional 2 mg.

## 2019-08-28 NOTE — ED Notes (Signed)
Pt had coughing spell after combivent inhaler, desat to 84%, O2 increased to 5L per [place] and NRB at 15L. Pt turned to left lateral. Pt tolerated well.

## 2019-08-28 NOTE — ED Notes (Signed)
Respiratory called and informed pt been on NRB since this am, added [place] with 4L after combivent inhaler this afternoon and coughing spell with desat, just had another dose of combivent and coughing spell, desat to 84-85%, [place] increased to 5L, pt sats now 89%. Per respiratory will come assess pt.

## 2019-08-28 NOTE — ED Notes (Signed)
Respiratory at bedside.

## 2019-08-28 NOTE — ED Notes (Signed)
Pt desat to low 80's, pt with increased work of breathing.

## 2019-08-28 NOTE — Progress Notes (Signed)
PROGRESS NOTE  RASTUS BORTON ZOX:[id number] DOB: 01/03/1971 DOA: 08/27/2019 PCP: Patient, No Pcp Per  Brief History:  48 year old male with a history of "prediabetes" and DVT/PE presented with 1 day history of worsening shortness of breath.  The patient states that he was diagnosed with Covid on 08/26/2019.  Prior to that, he had been having only mild symptoms with nonproductive cough and low-grade temperatures without any nausea, vomiting, diarrhea, shortness of breath.  The patient checked his oxygen saturation at home and noted to be 86% on room air.  As result, EMS was activated.  He was placed on 6 L with oxygen saturation 89%. In the emergency department, the patient was afebrile but tachycardic in the 120s.  Oxygen saturation was 89% 6 L.  He was started on remdesivir and given a dose of dexamethasone 8 mg.  He was subsequently placed on 15 L with oxygen saturation 89-90%.  Actemra has been ordered.  WBC 4.0, hemoglobin 15.6, platelets 193,000.  Assessment/Plan: Acute respiratory failure secondary to COVID-19 pneumonia -Continue remdesivir and IV steroids -Place on nonrebreather -Lactic acid 1.3 -Ferritin 457 -BNP 21.0 -D-dimer 0.54 -PCT <0.10 -CRP 12.6 -Start Actemra if LFTs < 5X ULN -08/27/2019 CTA chest--no central PE, diffuse bilateral groundglass opacities -personally reviewed EKG--sinus, no STT changes -start combivent -continue vitamin C and zinc  Diabetes mellitus type 2 with hyperglycemia, uncontrolled -Serum glucose 301 even before steroids were given -Hemoglobin A1c -Start Levemir -NovoLog sliding scale  Morbid obesity -BMI 42.33 -Lifestyle modification  Hypokalemia -Replete -Magnesium 2.0         Disposition Plan:   Transfer to [facility] Family Communication:   Family at bedside  Consultants:  none  Code Status:  FULL   DVT Prophylaxis:  [place] Lovenox   Procedures: As Listed in Progress Note Above  Antibiotics: remdesivir 12/30>>>    Total time spent 35 minutes.  Greater than 50% spent face to face counseling and coordinating care.     Subjective: Pt states he is breathing a little better than yesterday but remains sob.  Denies cp, n//d.  Had some loose stools but denies abd pain, dysuria, hematochezia, melena, headache.  Has nonproductive cough and occasional post-tussive emesis--no blood  Objective: Vitals:   08/28/19 0600 08/28/19 0615 08/28/19 0630 08/28/19 0700  BP: 118/74  129/77 120/81  Pulse: 98 99 97 99  Resp: (!) 30 (!) 26 (!) 25 (!) 35  Temp:      TempSrc:      SpO2: (!) 88% (!) 88% (!) 86% (!) 89%  Weight:      Height:       No intake or output data in the 24 hours ending 08/28/19 0843 Weight change:  Exam:   General:  Pt is alert, follows commands appropriately, not in acute distress  HEENT: No icterus, No thrush, No neck mass, [place]/AT  Cardiovascular: RRR, S1/S2, no rubs, no gallops  Respiratory: bilateral rales.  Bibasilar wheeze  Abdomen: Soft/+BS, non tender, non distended, no guarding  Extremities: No edema, No lymphangitis, No petechiae, No rashes, no synovitis   Data Reviewed: I have personally reviewed following labs and imaging studies Basic Metabolic Panel: Recent Labs  Lab 08/27/19 2030  NA 130*  K 3.1*  CL 100  CO2 15*  GLUCOSE 301*  BUN 12  CREATININE 0.78  CALCIUM 9.1  MG 2.0  PHOS 3.0   Liver Function Tests: No results for input(s): AST, ALT, ALKPHOS, BILITOT, PROT, ALBUMIN  in the last 168 hours. No results for input(s): LIPASE, AMYLASE in the last 168 hours. No results for input(s): AMMONIA in the last 168 hours. Coagulation Profile: No results for input(s): INR, PROTIME in the last 168 hours. CBC: Recent Labs  Lab 08/27/19 2030  WBC 4.0  HGB 15.6  HCT 44.3  MCV 84.4  PLT 193   Cardiac Enzymes: No results for input(s): CKTOTAL, CKMB, CKMBINDEX, TROPONINI in the last 168 hours. BNP: Invalid input(s): POCBNP CBG: Recent Labs  Lab 08/28/19 0156  08/28/19 0340  GLUCAP 265* 275*   HbA1C: No results for input(s): HGBA1C in the last 72 hours. Urine analysis:    Component Value Date/Time   COLORURINE YELLOW 05/10/2010 2326   APPEARANCEUR HAZY (A) 05/10/2010 2326   LABSPEC 1.025 05/10/2010 2326   PHURINE 6.0 05/10/2010 2326   GLUCOSEU NEGATIVE 05/10/2010 2326   HGBUR LARGE (A) 05/10/2010 2326   BILIRUBINUR NEGATIVE 05/10/2010 2326   KETONESUR NEGATIVE 05/10/2010 2326   PROTEINUR 100 (A) 05/10/2010 2326   UROBILINOGEN 0.2 05/10/2010 2326   NITRITE POSITIVE (A) 05/10/2010 2326   LEUKOCYTESUR MODERATE (A) 05/10/2010 2326   Sepsis Labs: @LABRCNTIP (procalcitonin:4,lacticidven:4) ) Recent Results (from the past 240 hour(s))  Novel Coronavirus, NAA ([facility])     Status: Abnormal   Collection Time: 08/26/19 10:25 AM   Specimen: Nasopharyngeal(NP) swabs in vial transport medium   NASOPHARYNGE  TESTING  Result Value Ref Range Status   SARS-CoV-2, NAA Detected (A) Not Detected Final    Comment: Testing was performed using the cobas(R) SARS-CoV-2 test. This nucleic acid amplification test was developed and its performance characteristics determined by [facility]. Nucleic acid amplification tests include PCR and TMA. This test has not been FDA cleared or approved. This test has been authorized by FDA under an Emergency Use Authorization (EUA). This test is only authorized for the duration of time the declaration that circumstances exist justifying the authorization of the emergency use of in vitro diagnostic tests for detection of SARS-CoV-2 virus and/or diagnosis of COVID-19 infection under section 564(b)(1) of the Act, 21 U.S.C. 952WUX-3(K360bbb-3(b) (1), unless the authorization is terminated or revoked sooner. When diagnostic testing is negative, the possibility of a false negative result should be considered in the context of a patient's recent exposures and the presence of clinical signs and symptoms consistent with  COVID-19. An individual without symptoms  of COVID-19 and who is not shedding SARS-CoV-2 virus would expect to have a negative (not detected) result in this assay.   Blood Culture (routine x 2)     Status: None (Preliminary result)   Collection Time: 08/27/19  9:44 PM   Specimen: Left Antecubital; Blood  Result Value Ref Range Status   Specimen Description LEFT ANTECUBITAL  Final   Special Requests Blood Culture adequate volume  Final   Culture   Final    NO GROWTH < 12 HOURS Performed at [facility], [address] [place], [place] [address]    Report Status PENDING  Incomplete  Blood Culture (routine x 2)     Status: None (Preliminary result)   Collection Time: 08/27/19  9:45 PM   Specimen: BLOOD LEFT HAND  Result Value Ref Range Status   Specimen Description BLOOD LEFT HAND  Final   Special Requests   Final    BOTTLES DRAWN AEROBIC AND ANAEROBIC Blood Culture adequate volume   Culture   Final    NO GROWTH < 12 HOURS Performed at [facility], [address] [place], [place] [address]  Report Status PENDING  Incomplete     Scheduled Meds: . vitamin C  500 mg Oral Daily  . enoxaparin (LOVENOX) injection  65 mg Subcutaneous Q24H  . insulin aspart  0-20 Units Subcutaneous Q4H  . insulin detemir  0.1 Units/kg Subcutaneous BID  . Ipratropium-Albuterol  2 puff Inhalation Q6H  . methylPREDNISolone (SOLU-MEDROL) injection  70 mg Intravenous Q12H  . zinc sulfate  220 mg Oral Daily   Continuous Infusions: . 0.9 % NaCl with KCl 40 mEq / L    . [START ON 08/29/2019] remdesivir 100 mg in NS 100 mL    . tocilizumab (ACTEMRA) IV      Procedures/Studies: CT Angio Chest PE W and/or Wo Contrast  Result Date: 08/27/2019 CLINICAL DATA:  Shortness of breath EXAM: CT ANGIOGRAPHY CHEST WITH CONTRAST TECHNIQUE: Multidetector CT imaging of the chest was performed using the standard protocol during bolus administration of intravenous contrast. Multiplanar CT image reconstructions and MIPs  were obtained to evaluate the vascular anatomy. CONTRAST:  170mL OMNIPAQUE IOHEXOL 350 MG/ML SOLN COMPARISON:  None. FINDINGS: Cardiovascular: Evaluation for pulmonary emboli is limited by significant respiratory motion artifact.Given this limitation, there is no significant pulmonary embolism centrally. The main pulmonary artery is within normal limits for size. There is no CT evidence of acute right heart strain. The visualized aorta is normal. Heart size is normal, without pericardial effusion. Mediastinum/Nodes: --No mediastinal or hilar lymphadenopathy. --No axillary lymphadenopathy. --No supraclavicular lymphadenopathy. --Normal thyroid gland. --The esophagus is unremarkable Lungs/Pleura: There are diffuse bilateral ground-glass airspace opacities. There is no pneumothorax or large pleural effusion. The trachea is unremarkable. Upper Abdomen: No acute abnormality. Musculoskeletal: No chest wall abnormality. No acute or significant osseous findings. Bilateral gynecomastia is noted. Review of the MIP images confirms the above findings. IMPRESSION: 1. Evaluation for pulmonary emboli is limited by significant respiratory motion artifact. Given this limitation, there is no significant pulmonary embolism centrally. 2. Diffuse bilateral ground-glass airspace opacities, consistent with the patient's history of viral pneumonia. Electronically Signed   By: Constance Holster M.D.   On: 08/27/2019 23:09   DG Chest Portable 1 View  Result Date: 08/27/2019 CLINICAL DATA:  Shortness of breath history of positive COVID test EXAM: PORTABLE CHEST 1 VIEW COMPARISON:  None. FINDINGS: Low lung volumes. Patchy airspace opacities throughout the right lung and at the left base. Heart size upper normal, likely augmented by low lung volume. Negative for pneumothorax. IMPRESSION: Patchy bilateral airspace opacities, suspect for pneumonia. Electronically Signed   By: Donavan Foil M.D.   On: 08/27/2019 20:54    Orson Eva, DO   [facility] Pager [phone]  If 7PM-7AM, please contact night-coverage [email or web address] Password [email or web address] 08/28/2019, 8:43 AM   LOS: 1 day

## 2019-08-28 NOTE — ED Notes (Signed)
Report from night shift nurse states Pharmacy did not want to send ACETEMRA stating that it might not be given in ED.

## 2019-08-28 NOTE — ED Notes (Signed)
[facility] at bedside 

## 2019-08-28 NOTE — ED Notes (Signed)
Dr Tat at bedside 

## 2019-08-28 NOTE — ED Notes (Signed)
RN remains at bedside. Pt O2 sats 91% at this time. Pt resting with eyes closed.

## 2019-08-28 NOTE — ED Notes (Signed)
Dr Tat informed pt sats ranging mid 10's. Orders received to place on NRB.

## 2019-08-28 NOTE — ED Notes (Signed)
Dr Tat paged and informed [place] placed at 4L in addition to NRB at 15L, sats now 91%.

## 2019-08-28 NOTE — ED Notes (Signed)
Pt placed on NRB at 15L, O2 sats ranging 89-91%. Dr Tat informed and states ok to stay on NRB as long as sats stay 88-92%.

## 2019-08-28 NOTE — ED Notes (Signed)
Dr Tat paged and informed desat to low 80's, pt now 82% with NRB. Respiratory also notified.

## 2019-08-28 NOTE — ED Notes (Signed)
Pt O2 sats at this time 86-88%. Pt laying on left lateral tilt, resting.

## 2019-08-28 NOTE — Progress Notes (Addendum)
Patient admitted to Unit. Alert and Oriented *4. Utilizing 15 LNRB + 15 LHFNC. SPO2 @ 90-93 %. RR 22-24 bmp. Up in bed at high-fowlers position. Vitals otherwise stable. Skin assessment -WDL. Oriented to room and call bell. Educated about fall risk. Urinal at bedside. Bed alarm on.    08/28/19 2200  Vitals  Temp 98.8 F (37.1 C)  Temp Source Axillary  BP 118/87  MAP (mmHg) 97  BP Location Right Arm  BP Method Automatic  Patient Position (if appropriate) Sitting  Pulse Rate 92  Pulse Rate Source Dinamap  ECG Heart Rate 87  Cardiac Rhythm NSR  Resp (!) 23  Oxygen Therapy  SpO2 93 %  Pain Assessment  Pain Scale 0-10  Pain Score 0  Patients Stated Pain Goal 0  MEWS Score  MEWS RR 1  MEWS Pulse 0  MEWS Systolic 0  MEWS LOC 0  MEWS Temp 0  MEWS Score 1  MEWS Score Color Green

## 2019-08-28 NOTE — ED Notes (Signed)
Pt placed on [place] at 4L in addition to NRB at 15L. Sats increased to 91%.

## 2019-08-29 LAB — CBC WITH DIFFERENTIAL/PLATELET
Abs Immature Granulocytes: 0.04 10*3/uL (ref 0.00–0.07)
Basophils Absolute: 0 10*3/uL (ref 0.0–0.1)
Basophils Relative: 0 %
Eosinophils Absolute: 0 10*3/uL (ref 0.0–0.5)
Eosinophils Relative: 0 %
HCT: 44.6 % (ref 39.0–52.0)
Hemoglobin: 15 g/dL (ref 13.0–17.0)
Immature Granulocytes: 1 %
Lymphocytes Relative: 13 %
Lymphs Abs: 0.6 10*3/uL — ABNORMAL LOW (ref 0.7–4.0)
MCH: 29.1 pg (ref 26.0–34.0)
MCHC: 33.6 g/dL (ref 30.0–36.0)
MCV: 86.4 fL (ref 80.0–100.0)
Monocytes Absolute: 0.3 10*3/uL (ref 0.1–1.0)
Monocytes Relative: 6 %
Neutro Abs: 3.7 10*3/uL (ref 1.7–7.7)
Neutrophils Relative %: 80 %
Platelets: 269 10*3/uL (ref 150–400)
RBC: 5.16 MIL/uL (ref 4.22–5.81)
RDW: 13.2 % (ref 11.5–15.5)
WBC: 4.6 10*3/uL (ref 4.0–10.5)
nRBC: 0 % (ref 0.0–0.2)

## 2019-08-29 LAB — BASIC METABOLIC PANEL
Anion gap: 11 (ref 5–15)
BUN: 18 mg/dL (ref 6–20)
CO2: 23 mmol/L (ref 22–32)
Calcium: 8.8 mg/dL — ABNORMAL LOW (ref 8.9–10.3)
Chloride: 103 mmol/L (ref 98–111)
Creatinine, Ser: 0.75 mg/dL (ref 0.61–1.24)
GFR calc Af Amer: >60 mL/min (ref >60)
GFR calc non Af Amer: >60 mL/min (ref >60)
Glucose, Bld: 264 mg/dL — ABNORMAL HIGH (ref 70–99)
Potassium: 4.3 mmol/L (ref 3.5–5.1)
Sodium: 137 mmol/L (ref 135–145)

## 2019-08-29 LAB — GLUCOSE, CAPILLARY
Glucose-Capillary: 246 mg/dL — ABNORMAL HIGH (ref 70–99)
Glucose-Capillary: 237 mg/dL — ABNORMAL HIGH (ref 70–99)
Glucose-Capillary: 324 mg/dL — ABNORMAL HIGH (ref 70–99)
Glucose-Capillary: 278 mg/dL — ABNORMAL HIGH (ref 70–99)
Glucose-Capillary: 244 mg/dL — ABNORMAL HIGH (ref 70–99)

## 2019-08-29 LAB — HEMOGLOBIN A1C
Hgb A1c MFr Bld: 9.9 % — ABNORMAL HIGH (ref 4.8–5.6)
Mean Plasma Glucose: 237.43 mg/dL

## 2019-08-29 LAB — SAMPLE TO BLOOD BANK

## 2019-08-29 MED ORDER — INSULIN ASPART 100 UNIT/ML ~~LOC~~ SOLN
0.0000 [IU] | Freq: Three times a day (TID) | SUBCUTANEOUS | Status: DC
  Administered 2019-08-29: 13:00:00 15 [IU] via SUBCUTANEOUS
  Administered 2019-08-29: 7 [IU] via SUBCUTANEOUS
  Administered 2019-08-29 – 2019-08-30 (×2): 11 [IU] via SUBCUTANEOUS
  Administered 2019-08-30: 13:00:00 15 [IU] via SUBCUTANEOUS
  Administered 2019-08-30: 7 [IU] via SUBCUTANEOUS
  Administered 2019-08-31: 13:00:00 4 [IU] via SUBCUTANEOUS
  Administered 2019-08-31: 18:00:00 7 [IU] via SUBCUTANEOUS
  Administered 2019-08-31: 09:00:00 4 [IU] via SUBCUTANEOUS
  Administered 2019-09-01: 09:00:00 3 [IU] via SUBCUTANEOUS
  Administered 2019-09-01 (×2): 4 [IU] via SUBCUTANEOUS
  Administered 2019-09-02 – 2019-09-03 (×2): 3 [IU] via SUBCUTANEOUS
  Administered 2019-09-04: 18:00:00 4 [IU] via SUBCUTANEOUS
  Administered 2019-09-05: 18:00:00 7 [IU] via SUBCUTANEOUS
  Administered 2019-09-06 (×2): 4 [IU] via SUBCUTANEOUS
  Administered 2019-09-06: 17:00:00 7 [IU] via SUBCUTANEOUS
  Administered 2019-09-07: 17:00:00 11 [IU] via SUBCUTANEOUS
  Administered 2019-09-07: 12:00:00 7 [IU] via SUBCUTANEOUS
  Administered 2019-09-07: 09:00:00 4 [IU] via SUBCUTANEOUS
  Administered 2019-09-08 (×2): 11 [IU] via SUBCUTANEOUS
  Administered 2019-09-08: 09:00:00 4 [IU] via SUBCUTANEOUS
  Administered 2019-09-09: 12:00:00 20 [IU] via SUBCUTANEOUS
  Administered 2019-09-09: 11 [IU] via SUBCUTANEOUS
  Administered 2019-09-09: 08:00:00 4 [IU] via SUBCUTANEOUS
  Administered 2019-09-10: 13:00:00 11 [IU] via SUBCUTANEOUS
  Administered 2019-09-10: 09:00:00 4 [IU] via SUBCUTANEOUS
  Administered 2019-09-10: 17:00:00 11 [IU] via SUBCUTANEOUS
  Administered 2019-09-11: 12:00:00 15 [IU] via SUBCUTANEOUS
  Administered 2019-09-11: 08:00:00 4 [IU] via SUBCUTANEOUS
  Administered 2019-09-11: 18:00:00 11 [IU] via SUBCUTANEOUS
  Administered 2019-09-12: 08:00:00 4 [IU] via SUBCUTANEOUS
  Administered 2019-09-12: 13:00:00 7 [IU] via SUBCUTANEOUS
  Administered 2019-09-12: 11 [IU] via SUBCUTANEOUS
  Administered 2019-09-13: 16:00:00 7 [IU] via SUBCUTANEOUS
  Administered 2019-09-13: 08:00:00 4 [IU] via SUBCUTANEOUS
  Administered 2019-09-13: 13:00:00 11 [IU] via SUBCUTANEOUS
  Administered 2019-09-14: 3 [IU] via SUBCUTANEOUS
  Administered 2019-09-14: 12:00:00 7 [IU] via SUBCUTANEOUS
  Administered 2019-09-14: 16:00:00 4 [IU] via SUBCUTANEOUS
  Administered 2019-09-15: 12:00:00 3 [IU] via SUBCUTANEOUS
  Administered 2019-09-15: 17:00:00 4 [IU] via SUBCUTANEOUS
  Administered 2019-09-15 – 2019-09-16 (×3): 3 [IU] via SUBCUTANEOUS
  Administered 2019-09-16: 12:00:00 4 [IU] via SUBCUTANEOUS
  Administered 2019-09-17 (×2): 3 [IU] via SUBCUTANEOUS

## 2019-08-29 MED ORDER — DEXAMETHASONE 6 MG PO TABS
6.0000 mg | ORAL_TABLET | Freq: Two times a day (BID) | ORAL | Status: DC
  Administered 2019-08-29 – 2019-09-02 (×8): 6 mg via ORAL
  Filled 2019-08-29 (×8): qty 1

## 2019-08-29 MED ORDER — PANTOPRAZOLE SODIUM 40 MG PO TBEC
40.0000 mg | DELAYED_RELEASE_TABLET | Freq: Two times a day (BID) | ORAL | Status: DC
  Administered 2019-08-29 – 2019-09-17 (×39): 40 mg via ORAL
  Filled 2019-08-29 (×38): qty 1

## 2019-08-29 MED ORDER — INSULIN DETEMIR 100 UNIT/ML ~~LOC~~ SOLN
20.0000 [IU] | Freq: Two times a day (BID) | SUBCUTANEOUS | Status: DC
  Administered 2019-08-29: 20 [IU] via SUBCUTANEOUS
  Filled 2019-08-29 (×2): qty 0.2

## 2019-08-29 MED ORDER — INSULIN ASPART 100 UNIT/ML ~~LOC~~ SOLN
0.0000 [IU] | Freq: Every day | SUBCUTANEOUS | Status: DC
  Administered 2019-08-29 – 2019-08-30 (×2): 2 [IU] via SUBCUTANEOUS
  Administered 2019-09-07: 4 [IU] via SUBCUTANEOUS
  Administered 2019-09-08 – 2019-09-09 (×2): 2 [IU] via SUBCUTANEOUS
  Administered 2019-09-10: 4 [IU] via SUBCUTANEOUS
  Administered 2019-09-11 – 2019-09-12 (×2): 2 [IU] via SUBCUTANEOUS

## 2019-08-29 MED ORDER — INSULIN ASPART 100 UNIT/ML ~~LOC~~ SOLN
6.0000 [IU] | Freq: Three times a day (TID) | SUBCUTANEOUS | Status: DC
  Administered 2019-08-29 – 2019-09-03 (×17): 6 [IU] via SUBCUTANEOUS

## 2019-08-29 MED ORDER — DEXAMETHASONE 4 MG PO TABS
8.0000 mg | ORAL_TABLET | Freq: Every day | ORAL | Status: DC
  Administered 2019-08-29: 8 mg via ORAL
  Filled 2019-08-29: qty 2

## 2019-08-29 MED ORDER — INSULIN DETEMIR 100 UNIT/ML ~~LOC~~ SOLN
7.0000 [IU] | Freq: Once | SUBCUTANEOUS | Status: AC
  Administered 2019-08-29: 7 [IU] via SUBCUTANEOUS
  Filled 2019-08-29: qty 0.07

## 2019-08-29 NOTE — Progress Notes (Signed)
[facility] PROGRESS NOTE    Progress Note  Henry Miller  VFI:[id number] DOB: June 15, 1971 DOA: 08/27/2019 PCP: Patient, No Pcp Per     Brief Narrative:   Henry Miller is an 48 y.o. male past medical history of prediabetes, DVT PE presents with a 1 day history of worsening shortness of breath he relates he tested positive for SARS-CoV-2 on 08/26/2019.  Prior to this he had been having mild symptoms of nonproductive cough and a low-grade fever accompanied by nausea vomiting diarrhea.  Checked his saturations at home and was 86% on room air, EMS placed him in 6 L and improved to 89 found tachycardic in the ED and satting 89% on 6 L we will start on remdesivir and steroids oxygen supplementation was increased to 15 L and saturations improved.  Actemra has been given  Assessment/Plan:   Acute respiratory failure with hypoxia due to Pneumonia due to COVID-19 virus He is requiring 15 L of oxygen to keep saturations greater than 89%. Inflamatory markers are significantly elevated and trending up to SARS-CoV-2 test was positive on admission. He was started on IV remdesivir, steroids and received Actemra on 08/28/2019. Try to keep the patient prone for early 16 hours a day, if not prone out of bed to chair, continue incentive spirometry. He relates his breathing is unchanged from yesterday, he cannot speak in full sentences.  Type 2 diabetes mellitus ([place]) With a hemoglobin A1c of 9.9 He was started on steroids which would make his blood glucose erratic, change long-acting insulin to twice a day, change sliding scale to resistance.  Morbid obesity with BMI of 40.0-44.9, adult ([place]) Counseling.  Hypokalemia: Treated orally, basic metabolic panel this morning is pending.      DVT prophylaxis: lovenox Family Communication:none Disposition Plan/Barrier to D/C: unable to determine Code Status:     Code Status Orders  (From admission, onward)         Start     Ordered   08/28/19 0044  Full code  Continuous     08/28/19 0043        Code Status History    This patient has a current code status but no historical code status.   Advance Care Planning Activity        IV Access:    Peripheral IV   Procedures and diagnostic studies:   CT Angio Chest PE W and/or Wo Contrast  Result Date: 08/27/2019 CLINICAL DATA:  Shortness of breath EXAM: CT ANGIOGRAPHY CHEST WITH CONTRAST TECHNIQUE: Multidetector CT imaging of the chest was performed using the standard protocol during bolus administration of intravenous contrast. Multiplanar CT image reconstructions and MIPs were obtained to evaluate the vascular anatomy. CONTRAST:  126mL OMNIPAQUE IOHEXOL 350 MG/ML SOLN COMPARISON:  None. FINDINGS: Cardiovascular: Evaluation for pulmonary emboli is limited by significant respiratory motion artifact.Given this limitation, there is no significant pulmonary embolism centrally. The main pulmonary artery is within normal limits for size. There is no CT evidence of acute right heart strain. The visualized aorta is normal. Heart size is normal, without pericardial effusion. Mediastinum/Nodes: --No mediastinal or hilar lymphadenopathy. --No axillary lymphadenopathy. --No supraclavicular lymphadenopathy. --Normal thyroid gland. --The esophagus is unremarkable Lungs/Pleura: There are diffuse bilateral ground-glass airspace opacities. There is no pneumothorax or large pleural effusion. The trachea is unremarkable. Upper Abdomen: No acute abnormality. Musculoskeletal: No chest wall abnormality. No acute or significant osseous findings. Bilateral gynecomastia is noted. Review of the MIP images confirms the above findings. IMPRESSION: 1. Evaluation for pulmonary  emboli is limited by significant respiratory motion artifact. Given this limitation, there is no significant pulmonary embolism centrally. 2. Diffuse bilateral ground-glass airspace opacities, consistent with the patient's history of  viral pneumonia. Electronically Signed   By: Katherine Mantle M.D.   On: 08/27/2019 23:09   DG Chest Portable 1 View  Result Date: 08/27/2019 CLINICAL DATA:  Shortness of breath history of positive COVID test EXAM: PORTABLE CHEST 1 VIEW COMPARISON:  None. FINDINGS: Low lung volumes. Patchy airspace opacities throughout the right lung and at the left base. Heart size upper normal, likely augmented by low lung volume. Negative for pneumothorax. IMPRESSION: Patchy bilateral airspace opacities, suspect for pneumonia. Electronically Signed   By: Jasmine Pang M.D.   On: 08/27/2019 20:54     Medical Consultants:    None.  Anti-Infectives:   IV remdesivir  Subjective:    Henry Miller he relates his breathing is unchanged compared to yesterday.  Objective:    Vitals:   08/28/19 2102 08/28/19 2200 08/29/19 0300 08/29/19 0705  BP:  118/87 112/81 (!) 116/91  Pulse: 88 92 96 87  Resp: (!) 22 (!) 23 (!) 21 18  Temp:  98.8 F (37.1 C) 98 F (36.7 C) 97.7 F (36.5 C)  TempSrc:  Axillary Axillary Axillary  SpO2: 90% 93% (!) 89% 92%  Weight:      Height:       SpO2: 92 % O2 Flow Rate (L/min): 15 L/min   Intake/Output Summary (Last 24 hours) at 08/29/2019 0720 Last data filed at 08/29/2019 0700 Gross per 24 hour  Intake 1115.92 ml  Output 1000 ml  Net 115.92 ml   Filed Weights   08/27/19 1945  Weight: 133.8 kg    Exam: General exam: In no acute distress. Respiratory system: Good air movement and diffuse crackles bilaterally Cardiovascular system: S1 & S2 heard, RRR. No JVD. Gastrointestinal system: Abdomen is nondistended, soft and nontender.  Central nervous system: Alert and oriented. No focal neurological deficits. Extremities: No pedal edema. Skin: No rashes, lesions or ulcers Psychiatry: Judgement and insight appear normal. Mood & affect appropriate.    Data Reviewed:    Labs: Basic Metabolic Panel: Recent Labs  Lab 08/27/19 2030  NA 130*  K 3.1*    CL 100  CO2 15*  GLUCOSE 301*  BUN 12  CREATININE 0.78  CALCIUM 9.1  MG 2.0  PHOS 3.0   GFR Estimated Creatinine Clearance: 157.1 mL/min (by C-G formula based on SCr of 0.78 mg/dL). Liver Function Tests: Recent Labs  Lab 08/28/19 0922  AST 36  ALT 33  ALKPHOS 59  BILITOT 1.0  PROT 7.0  ALBUMIN 3.2*   No results for input(s): LIPASE, AMYLASE in the last 168 hours. No results for input(s): AMMONIA in the last 168 hours. Coagulation profile No results for input(s): INR, PROTIME in the last 168 hours. COVID-19 Labs  Recent Labs    08/27/19 2030 08/28/19 0922  DDIMER 0.54*  --   FERRITIN 457* 442*  LDH 303*  --   CRP 12.6* 13.8*    Lab Results  Component Value Date   SARSCOV2NAA Detected (A) 08/26/2019    CBC: Recent Labs  Lab 08/27/19 2030  WBC 4.0  HGB 15.6  HCT 44.3  MCV 84.4  PLT 193   Cardiac Enzymes: No results for input(s): CKTOTAL, CKMB, CKMBINDEX, TROPONINI in the last 168 hours. BNP (last 3 results) No results for input(s): PROBNP in the last 8760 hours. CBG: Recent Labs  Lab 08/28/19  1223 08/28/19 1720 08/28/19 2256 08/29/19 0305 08/29/19 0706  GLUCAP 278* 266* 263* 246* 237*   D-Dimer: Recent Labs    08/27/19 2030  DDIMER 0.54*   Hgb A1c: No results for input(s): HGBA1C in the last 72 hours. Lipid Profile: Recent Labs    08/27/19 2030  TRIG 108   Thyroid function studies: No results for input(s): TSH, T4TOTAL, T3FREE, THYROIDAB in the last 72 hours.  Invalid input(s): FREET3 Anemia work up: Recent Labs    08/27/19 2030 08/28/19 0922  FERRITIN 457* 442*   Sepsis Labs: Recent Labs  Lab 08/27/19 2030 08/27/19 2144 08/28/19 0922  PROCALCITON <0.10  --  <0.10  WBC 4.0  --   --   LATICACIDVEN  --  1.3  --    Microbiology Recent Results (from the past 240 hour(s))  Novel Coronavirus, NAA ([facility])     Status: Abnormal   Collection Time: 08/26/19 10:25 AM   Specimen: Nasopharyngeal(NP) swabs in vial transport  medium   NASOPHARYNGE  TESTING  Result Value Ref Range Status   SARS-CoV-2, NAA Detected (A) Not Detected Final    Comment: Testing was performed using the cobas(R) SARS-CoV-2 test. This nucleic acid amplification test was developed and its performance characteristics determined by [facility]. Nucleic acid amplification tests include PCR and TMA. This test has not been FDA cleared or approved. This test has been authorized by FDA under an Emergency Use Authorization (EUA). This test is only authorized for the duration of time the declaration that circumstances exist justifying the authorization of the emergency use of in vitro diagnostic tests for detection of SARS-CoV-2 virus and/or diagnosis of COVID-19 infection under section 564(b)(1) of the Act, 21 U.S.C. 161WRU-0(A360bbb-3(b) (1), unless the authorization is terminated or revoked sooner. When diagnostic testing is negative, the possibility of a false negative result should be considered in the context of a patient's recent exposures and the presence of clinical signs and symptoms consistent with COVID-19. An individual without symptoms  of COVID-19 and who is not shedding SARS-CoV-2 virus would expect to have a negative (not detected) result in this assay.   Blood Culture (routine x 2)     Status: None (Preliminary result)   Collection Time: 08/27/19  9:44 PM   Specimen: Left Antecubital; Blood  Result Value Ref Range Status   Specimen Description LEFT ANTECUBITAL  Final   Special Requests Blood Culture adequate volume  Final   Culture   Final    NO GROWTH < 24 HOURS Performed at [facility], [address] [place], [place] [address]    Report Status PENDING  Incomplete  Blood Culture (routine x 2)     Status: None (Preliminary result)   Collection Time: 08/27/19  9:45 PM   Specimen: BLOOD LEFT HAND  Result Value Ref Range Status   Specimen Description BLOOD LEFT HAND  Final   Special Requests   Final    BOTTLES DRAWN  AEROBIC AND ANAEROBIC Blood Culture adequate volume   Culture   Final    NO GROWTH < 24 HOURS Performed at [facility], [address] [place], [place] [address]    Report Status PENDING  Incomplete     Medications:   . vitamin C  500 mg Oral Daily  . enoxaparin (LOVENOX) injection  65 mg Subcutaneous Q24H  . insulin aspart  0-20 Units Subcutaneous Q4H  . insulin detemir  0.1 Units/kg Subcutaneous BID  . Ipratropium-Albuterol  2 puff Inhalation Q6H  . methylPREDNISolone (SOLU-MEDROL) injection  70 mg Intravenous Q12H  . zinc sulfate  220 mg Oral Daily   Continuous Infusions: . remdesivir 100 mg in NS 100 mL        LOS: 2 days   Marinda Elk  [facility]  08/29/2019, 7:20 AM

## 2019-08-29 NOTE — Progress Notes (Signed)
Patient's wife Nira Conn called with daily update.

## 2019-08-30 LAB — COMPREHENSIVE METABOLIC PANEL
ALT: 30 U/L (ref 0–44)
AST: 33 U/L (ref 15–41)
Albumin: 3.1 g/dL — ABNORMAL LOW (ref 3.5–5.0)
Alkaline Phosphatase: 58 U/L (ref 38–126)
Anion gap: 13 (ref 5–15)
BUN: 19 mg/dL (ref 6–20)
CO2: 23 mmol/L (ref 22–32)
Calcium: 8.7 mg/dL — ABNORMAL LOW (ref 8.9–10.3)
Chloride: 101 mmol/L (ref 98–111)
Creatinine, Ser: 0.76 mg/dL (ref 0.61–1.24)
GFR calc Af Amer: >60 mL/min (ref >60)
GFR calc non Af Amer: >60 mL/min (ref >60)
Glucose, Bld: 247 mg/dL — ABNORMAL HIGH (ref 70–99)
Potassium: 3.6 mmol/L (ref 3.5–5.1)
Sodium: 137 mmol/L (ref 135–145)
Total Bilirubin: 0.5 mg/dL (ref 0.3–1.2)
Total Protein: 6.3 g/dL — ABNORMAL LOW (ref 6.5–8.1)

## 2019-08-30 LAB — CBC WITH DIFFERENTIAL/PLATELET
Abs Immature Granulocytes: 0.2 10*3/uL — ABNORMAL HIGH (ref 0.00–0.07)
Basophils Absolute: 0 10*3/uL (ref 0.0–0.1)
Basophils Relative: 0 %
Eosinophils Absolute: 0 10*3/uL (ref 0.0–0.5)
Eosinophils Relative: 0 %
HCT: 45 % (ref 39.0–52.0)
Hemoglobin: 15.4 g/dL (ref 13.0–17.0)
Immature Granulocytes: 3 %
Lymphocytes Relative: 7 %
Lymphs Abs: 0.5 10*3/uL — ABNORMAL LOW (ref 0.7–4.0)
MCH: 29.3 pg (ref 26.0–34.0)
MCHC: 34.2 g/dL (ref 30.0–36.0)
MCV: 85.6 fL (ref 80.0–100.0)
Monocytes Absolute: 0.5 10*3/uL (ref 0.1–1.0)
Monocytes Relative: 7 %
Neutro Abs: 5.9 10*3/uL (ref 1.7–7.7)
Neutrophils Relative %: 83 %
Platelets: 342 10*3/uL (ref 150–400)
RBC: 5.26 MIL/uL (ref 4.22–5.81)
RDW: 12.8 % (ref 11.5–15.5)
WBC: 7.1 10*3/uL (ref 4.0–10.5)
nRBC: 0 % (ref 0.0–0.2)

## 2019-08-30 LAB — C-REACTIVE PROTEIN: CRP: 2.3 mg/dL — ABNORMAL HIGH (ref <1.0)

## 2019-08-30 LAB — D-DIMER, QUANTITATIVE: D-Dimer, Quant: 0.58 ug/mL-FEU — ABNORMAL HIGH (ref 0.00–0.50)

## 2019-08-30 LAB — GLUCOSE, CAPILLARY
Glucose-Capillary: 204 mg/dL — ABNORMAL HIGH (ref 70–99)
Glucose-Capillary: 217 mg/dL — ABNORMAL HIGH (ref 70–99)
Glucose-Capillary: 231 mg/dL — ABNORMAL HIGH (ref 70–99)
Glucose-Capillary: 257 mg/dL — ABNORMAL HIGH (ref 70–99)
Glucose-Capillary: 259 mg/dL — ABNORMAL HIGH (ref 70–99)
Glucose-Capillary: 312 mg/dL — ABNORMAL HIGH (ref 70–99)

## 2019-08-30 MED ORDER — TRAMADOL HCL 50 MG PO TABS
50.0000 mg | ORAL_TABLET | ORAL | Status: DC | PRN
  Administered 2019-08-30: 22:00:00 50 mg via ORAL
  Filled 2019-08-30: qty 1

## 2019-08-30 MED ORDER — INSULIN DETEMIR 100 UNIT/ML ~~LOC~~ SOLN
30.0000 [IU] | Freq: Two times a day (BID) | SUBCUTANEOUS | Status: DC
  Administered 2019-08-30: 11:00:00 30 [IU] via SUBCUTANEOUS
  Filled 2019-08-30 (×2): qty 0.3

## 2019-08-30 MED ORDER — INSULIN DETEMIR 100 UNIT/ML ~~LOC~~ SOLN
50.0000 [IU] | Freq: Two times a day (BID) | SUBCUTANEOUS | Status: DC
  Administered 2019-08-30 – 2019-09-02 (×6): 50 [IU] via SUBCUTANEOUS
  Filled 2019-08-30 (×6): qty 0.5

## 2019-08-30 NOTE — Plan of Care (Signed)

## 2019-08-30 NOTE — TOC Initial Note (Signed)
Transition of Care [facility]) - Initial/Assessment Note    Patient Details  Name: Henry Miller MRN: [id number] Date of Birth: 12/04/1970 Transition of Care [facility]) CM/SW Contact:    Durenda Guthrie, RN Phone Number: 08/30/2019, 8:15 AM  Clinical Narrative:  Patient is a 49 y.o. male, from home with family,  past medical history of prediabetes, DVT PE presented to ED  with a 1 day history of worsening shortness of breath. He tested positive for SARS-CoV-2 on 08/26/2019. Patient was admitted with dx of acute respiratory failure with hypoxia due to COVID 19 pneumonia , transferred to [facility] for further treatment. On 15 LHFNC, receiving Remdesivir until 1/4. Case manager will continue to monitor.                      Patient Goals and CMS Choice        Expected Discharge Plan and Services                                                Prior Living Arrangements/Services                       Activities of Daily Living Home Assistive Devices/Equipment: Bedside commode/3-in-1, Wheelchair ADL Screening (condition at time of admission) Patient's cognitive ability adequate to safely complete daily activities?: Yes Is the patient deaf or have difficulty hearing?: No Does the patient have difficulty seeing, even when wearing glasses/contacts?: No Does the patient have difficulty concentrating, remembering, or making decisions?: No Patient able to express need for assistance with ADLs?: No Does the patient have difficulty dressing or bathing?: Yes Independently performs ADLs?: Yes (appropriate for developmental age) Does the patient have difficulty walking or climbing stairs?: Yes Weakness of Legs: None Weakness of Arms/Hands: None  Permission Sought/Granted                  Emotional Assessment              Admission diagnosis:  Acute respiratory failure with hypoxia (HCC) [J96.01] Pneumonia due to COVID-19 virus [U07.1, J12.82] COVID-19 [U07.1] Patient  Active Problem List   Diagnosis Date Noted  . Hypokalemia 08/28/2019  . Acute respiratory failure with hypoxia (HCC) 08/28/2019  . Uncontrolled diabetes mellitus with hyperglycemia, without long-term current use of insulin (HCC) 08/28/2019  . Pneumonia due to COVID-19 virus 08/27/2019  . Type 2 diabetes mellitus (HCC) 08/27/2019  . Morbid obesity with BMI of 40.0-44.9, adult (HCC) 08/27/2019   PCP:  Patient, No Pcp Per Pharmacy:   [facility] [address], [place] - [address] [place] [address] Phone: [phone] Fax: [phone]     Social Determinants of Health (SDOH) Interventions    Readmission Risk Interventions No flowsheet data found.

## 2019-08-30 NOTE — Progress Notes (Signed)
[facility] PROGRESS NOTE    Progress Note  Henry Miller  FUX:[id number] DOB: 11-26-1970 DOA: 08/27/2019 PCP: Patient, No Pcp Per     Brief Narrative:   Henry Miller is an 49 y.o. male past medical history of prediabetes, DVT PE presents with a 1 day history of worsening shortness of breath he relates he tested positive for SARS-CoV-2 on 08/26/2019.  Prior to this he had been having mild symptoms of nonproductive cough and a low-grade fever accompanied by nausea vomiting diarrhea.  Checked his saturations at home and was 86% on room air, EMS placed him in 6 L and improved to 89 found tachycardic in the ED and satting 89% on 6 L we will start on remdesivir and steroids oxygen supplementation was increased to 15 L and saturations improved.  Actemra has been given  Assessment/Plan:   Acute respiratory failure with hypoxia due to Pneumonia due to COVID-19 virus On 15 Liters oxygen keep saturations greater 93% had an episode of desaturation overnight to 83% likely obstructive sleep apnea. His inflammatory markers are pending this morning. He was started on IV remdesivir, steroids and he received Actemra on 08/28/2019. Try to keep the patient peripherally 16 hours a day, if not prone out of bed to chair, continue statin spirometry. Relates his breathing is unchanged compared to yesterday.  Type 2 diabetes mellitus (HCC) With an A1c of 9.9, he is currently on steroids which will make his blood glucose erratic. His blood glucose continues to be elevated, will increase long-acting insulin to 30 units twice a day, continue resistant sliding scale.  Morbid obesity with BMI of 40.0-44.9, adult (HCC) Counseling.  Hypokalemia: Treated orally, basic metabolic panel this morning is pending.      DVT prophylaxis: lovenox Family Communication:none Disposition Plan/Barrier to D/C: unable to determine Code Status:     Code Status Orders  (From admission, onward)         Start      Ordered   08/28/19 0044  Full code  Continuous     08/28/19 0043        Code Status History    This patient has a current code status but no historical code status.   Advance Care Planning Activity        IV Access:    Peripheral IV   Procedures and diagnostic studies:   No results found.   Medical Consultants:    None.  Anti-Infectives:   IV remdesivir  Subjective:    BENSYN BORNEMANN relates his breathing is unchanged compared to yesterday.  Objective:    Vitals:   08/30/19 0000 08/30/19 0230 08/30/19 0300 08/30/19 0400  BP: 108/74  105/73   Pulse: 76  72   Resp: (!) 25  (!) 24 (!) 24  Temp: 98.7 F (37.1 C)  97.8 F (36.6 C)   TempSrc: Axillary  Axillary   SpO2: 96% 90% 93% 94%  Weight:      Height:       SpO2: 94 % O2 Flow Rate (L/min): 15 L/min   Intake/Output Summary (Last 24 hours) at 08/30/2019 0705 Last data filed at 08/30/2019 0000 Gross per 24 hour  Intake 580 ml  Output 1800 ml  Net -1220 ml   Filed Weights   08/27/19 1945  Weight: 133.8 kg    Exam: General exam: In no acute distress. Respiratory system: Good air movement and diffuse crackles bilaterally. Cardiovascular system: S1 & S2 heard, RRR. No JVD. Gastrointestinal system: Abdomen is  nondistended, soft and nontender.  Central nervous system: Alert and oriented. No focal neurological deficits. Extremities: No pedal edema. Skin: No rashes, lesions or ulcers Psychiatry: Judgement and insight appear normal. Mood & affect appropriate.     Data Reviewed:    Labs: Basic Metabolic Panel: Recent Labs  Lab 08/27/19 2030 08/29/19 0800  NA 130* 137  K 3.1* 4.3  CL 100 103  CO2 15* 23  GLUCOSE 301* 264*  BUN 12 18  CREATININE 0.78 0.75  CALCIUM 9.1 8.8*  MG 2.0  --   PHOS 3.0  --    GFR Estimated Creatinine Clearance: 157.1 mL/min (by C-G formula based on SCr of 0.75 mg/dL). Liver Function Tests: Recent Labs  Lab 08/28/19 0922  AST 36  ALT 33  ALKPHOS 59    BILITOT 1.0  PROT 7.0  ALBUMIN 3.2*   No results for input(s): LIPASE, AMYLASE in the last 168 hours. No results for input(s): AMMONIA in the last 168 hours. Coagulation profile No results for input(s): INR, PROTIME in the last 168 hours. COVID-19 Labs  Recent Labs    08/27/19 2030 08/28/19 0922  DDIMER 0.54*  --   FERRITIN 457* 442*  LDH 303*  --   CRP 12.6* 13.8*    Lab Results  Component Value Date   SARSCOV2NAA Detected (A) 08/26/2019    CBC: Recent Labs  Lab 08/27/19 2030 08/29/19 0800  WBC 4.0 4.6  NEUTROABS  --  3.7  HGB 15.6 15.0  HCT 44.3 44.6  MCV 84.4 86.4  PLT 193 269   Cardiac Enzymes: No results for input(s): CKTOTAL, CKMB, CKMBINDEX, TROPONINI in the last 168 hours. BNP (last 3 results) No results for input(s): PROBNP in the last 8760 hours. CBG: Recent Labs  Lab 08/29/19 1147 08/29/19 1512 08/29/19 2003 08/30/19 0021 08/30/19 0328  GLUCAP 324* 278* 244* 259* 231*   D-Dimer: Recent Labs    08/27/19 2030  DDIMER 0.54*   Hgb A1c: Recent Labs    08/29/19 0800  HGBA1C 9.9*   Lipid Profile: Recent Labs    08/27/19 2030  TRIG 108   Thyroid function studies: No results for input(s): TSH, T4TOTAL, T3FREE, THYROIDAB in the last 72 hours.  Invalid input(s): FREET3 Anemia work up: Recent Labs    08/27/19 2030 08/28/19 0922  FERRITIN 457* 442*   Sepsis Labs: Recent Labs  Lab 08/27/19 2030 08/27/19 2144 08/28/19 0922 08/29/19 0800  PROCALCITON <0.10  --  <0.10  --   WBC 4.0  --   --  4.6  LATICACIDVEN  --  1.3  --   --    Microbiology Recent Results (from the past 240 hour(s))  Novel Coronavirus, NAA ([facility])     Status: Abnormal   Collection Time: 08/26/19 10:25 AM   Specimen: Nasopharyngeal(NP) swabs in vial transport medium   NASOPHARYNGE  TESTING  Result Value Ref Range Status   SARS-CoV-2, NAA Detected (A) Not Detected Final    Comment: Testing was performed using the cobas(R) SARS-CoV-2 test. This nucleic  acid amplification test was developed and its performance characteristics determined by [facility]. Nucleic acid amplification tests include PCR and TMA. This test has not been FDA cleared or approved. This test has been authorized by FDA under an Emergency Use Authorization (EUA). This test is only authorized for the duration of time the declaration that circumstances exist justifying the authorization of the emergency use of in vitro diagnostic tests for detection of SARS-CoV-2 virus and/or diagnosis of COVID-19 infection under  section 564(b)(1) of the Act, 21 U.S.C. 841YSA-6(T) (1), unless the authorization is terminated or revoked sooner. When diagnostic testing is negative, the possibility of a false negative result should be considered in the context of a patient's recent exposures and the presence of clinical signs and symptoms consistent with COVID-19. An individual without symptoms  of COVID-19 and who is not shedding SARS-CoV-2 virus would expect to have a negative (not detected) result in this assay.   Blood Culture (routine x 2)     Status: None (Preliminary result)   Collection Time: 08/27/19  9:44 PM   Specimen: Left Antecubital; Blood  Result Value Ref Range Status   Specimen Description LEFT ANTECUBITAL  Final   Special Requests Blood Culture adequate volume  Final   Culture   Final    NO GROWTH 2 DAYS Performed at [facility], [address], [place], [place] [address]    Report Status PENDING  Incomplete  Blood Culture (routine x 2)     Status: None (Preliminary result)   Collection Time: 08/27/19  9:45 PM   Specimen: BLOOD LEFT HAND  Result Value Ref Range Status   Specimen Description BLOOD LEFT HAND  Final   Special Requests   Final    BOTTLES DRAWN AEROBIC AND ANAEROBIC Blood Culture adequate volume   Culture   Final    NO GROWTH 2 DAYS Performed at [facility], [address] [place], [place] [address]    Report Status PENDING  Incomplete      Medications:   . vitamin C  500 mg Oral Daily  . dexamethasone  6 mg Oral Q12H  . enoxaparin (LOVENOX) injection  65 mg Subcutaneous Q24H  . insulin aspart  0-20 Units Subcutaneous TID WC  . insulin aspart  0-5 Units Subcutaneous QHS  . insulin aspart  6 Units Subcutaneous TID WC  . insulin detemir  20 Units Subcutaneous BID  . pantoprazole  40 mg Oral BID  . zinc sulfate  220 mg Oral Daily   Continuous Infusions: . remdesivir 100 mg in NS 100 mL Stopped (08/29/19 1033)      LOS: 3 days   Charlynne Cousins  [facility]  08/30/2019, 7:05 AM

## 2019-08-31 LAB — COMPREHENSIVE METABOLIC PANEL
ALT: 28 U/L (ref 0–44)
AST: 29 U/L (ref 15–41)
Albumin: 3.2 g/dL — ABNORMAL LOW (ref 3.5–5.0)
Alkaline Phosphatase: 58 U/L (ref 38–126)
Anion gap: 11 (ref 5–15)
BUN: 18 mg/dL (ref 6–20)
CO2: 26 mmol/L (ref 22–32)
Calcium: 8.4 mg/dL — ABNORMAL LOW (ref 8.9–10.3)
Chloride: 99 mmol/L (ref 98–111)
Creatinine, Ser: 0.69 mg/dL (ref 0.61–1.24)
GFR calc Af Amer: >60 mL/min (ref >60)
GFR calc non Af Amer: >60 mL/min (ref >60)
Glucose, Bld: 169 mg/dL — ABNORMAL HIGH (ref 70–99)
Potassium: 3.3 mmol/L — ABNORMAL LOW (ref 3.5–5.1)
Sodium: 136 mmol/L (ref 135–145)
Total Bilirubin: 0.8 mg/dL (ref 0.3–1.2)
Total Protein: 5.9 g/dL — ABNORMAL LOW (ref 6.5–8.1)

## 2019-08-31 LAB — CBC WITH DIFFERENTIAL/PLATELET
Abs Immature Granulocytes: 0.09 10*3/uL — ABNORMAL HIGH (ref 0.00–0.07)
Basophils Absolute: 0 10*3/uL (ref 0.0–0.1)
Basophils Relative: 1 %
Eosinophils Absolute: 0 10*3/uL (ref 0.0–0.5)
Eosinophils Relative: 0 %
HCT: 44.3 % (ref 39.0–52.0)
Hemoglobin: 15.4 g/dL (ref 13.0–17.0)
Immature Granulocytes: 2 %
Lymphocytes Relative: 8 %
Lymphs Abs: 0.4 10*3/uL — ABNORMAL LOW (ref 0.7–4.0)
MCH: 29.8 pg (ref 26.0–34.0)
MCHC: 34.8 g/dL (ref 30.0–36.0)
MCV: 85.9 fL (ref 80.0–100.0)
Monocytes Absolute: 0.4 10*3/uL (ref 0.1–1.0)
Monocytes Relative: 6 %
Neutro Abs: 4.8 10*3/uL (ref 1.7–7.7)
Neutrophils Relative %: 83 %
Platelets: 328 10*3/uL (ref 150–400)
RBC: 5.16 MIL/uL (ref 4.22–5.81)
RDW: 12.6 % (ref 11.5–15.5)
WBC: 5.8 10*3/uL (ref 4.0–10.5)
nRBC: 0 % (ref 0.0–0.2)

## 2019-08-31 LAB — GLUCOSE, CAPILLARY
Glucose-Capillary: 147 mg/dL — ABNORMAL HIGH (ref 70–99)
Glucose-Capillary: 162 mg/dL — ABNORMAL HIGH (ref 70–99)
Glucose-Capillary: 165 mg/dL — ABNORMAL HIGH (ref 70–99)
Glucose-Capillary: 177 mg/dL — ABNORMAL HIGH (ref 70–99)
Glucose-Capillary: 196 mg/dL — ABNORMAL HIGH (ref 70–99)
Glucose-Capillary: 248 mg/dL — ABNORMAL HIGH (ref 70–99)

## 2019-08-31 LAB — C-REACTIVE PROTEIN: CRP: 1 mg/dL — ABNORMAL HIGH (ref <1.0)

## 2019-08-31 LAB — D-DIMER, QUANTITATIVE: D-Dimer, Quant: 0.64 ug/mL-FEU — ABNORMAL HIGH (ref 0.00–0.50)

## 2019-08-31 MED ORDER — POTASSIUM CHLORIDE CRYS ER 20 MEQ PO TBCR
20.0000 meq | EXTENDED_RELEASE_TABLET | Freq: Two times a day (BID) | ORAL | Status: DC
  Administered 2019-08-31 (×2): 20 meq via ORAL
  Filled 2019-08-31 (×2): qty 1

## 2019-08-31 NOTE — Progress Notes (Addendum)
Updates provided to patient spouse.

## 2019-08-31 NOTE — Progress Notes (Signed)
[facility] PROGRESS NOTE    Progress Note  Henry Miller  GDJ:[id number] DOB: [birth year] DOA: 08/27/2019 PCP: Patient, No Pcp Per     Brief Narrative:   Henry Miller is an 49 y.o. male past medical history of prediabetes, DVT PE presents with a 1 day history of worsening shortness of breath he relates he tested positive for SARS-CoV-2 on 08/26/2019.  Prior to this he had been having mild symptoms of nonproductive cough and a low-grade fever accompanied by nausea vomiting diarrhea.  Checked his saturations at home and was 86% on room air, EMS placed him in 6 L and improved to 89 found tachycardic in the ED and satting 89% on 6 L we will start on remdesivir and steroids oxygen supplementation was increased to 15 L and saturations improved.  Actemra has been given  Assessment/Plan:   Acute respiratory failure with hypoxia due to Pneumonia due to COVID-19 virus He is requiring 12 to 15 L of oxygen to keep saturations greater than 92%, he desats overnight likely due to obstructive sleep apnea due to body habitus. His inflammatory markers are significantly improved, continue IV steroids and IV remdesivir, he is status post IV Actemra on 08/28/2019. Try to keep the patient peripherally 16 hours a day, if not prone out of bed to chair. Patient continues not able to speak in full sentences he relates his breathing is better, he was able to prone for at least 10 hours as per patient.  Type 2 diabetes mellitus (HCC) With an A1c of 6.9, his blood glucose currently well controlled continue long-acting insulin per resistant scale insulin.  Morbid obesity with BMI of 40.0-44.9, adult ([place]) Counseling.  Hypokalemia: Treated orally, basic metabolic panel this morning is pending.     DVT prophylaxis: lovenox Family Communication:none Disposition Plan/Barrier to D/C: unable to determine Code Status:     Code Status Orders  (From admission, onward)         Start     Ordered   08/28/19 0044  Full code  Continuous     08/28/19 0043        Code Status History    This patient has a current code status but no historical code status.   Advance Care Planning Activity        IV Access:    Peripheral IV   Procedures and diagnostic studies:   No results found.   Medical Consultants:    None.  Anti-Infectives:   IV remdesivir  Subjective:    Henry Miller relates his breathing is better than yesterday.  Objective:    Vitals:   08/30/19 2200 08/31/19 0000 08/31/19 0358 08/31/19 0500  BP:  90/61 100/75   Pulse:  67 75   Resp: 18 19 18    Temp:  97.8 F (36.6 C) 97.7 F (36.5 C)   TempSrc:  Axillary Temporal   SpO2: 93% 97% 97% 97%  Weight:      Height:       SpO2: 97 % O2 Flow Rate (L/min): 15 L/min   Intake/Output Summary (Last 24 hours) at 08/31/2019 0713 Last data filed at 08/31/2019 0500 Gross per 24 hour  Intake 240 ml  Output 850 ml  Net -610 ml   Filed Weights   08/27/19 1945  Weight: 133.8 kg    Exam: General exam: In no acute distress. Respiratory system: Good air movement and diffuse crackles bilaterally Cardiovascular system: S1 & S2 heard, RRR. No JVD. Gastrointestinal system: Abdomen is nondistended,  soft and nontender.  Central nervous system: Alert and oriented. No focal neurological deficits. Extremities: No pedal edema. Skin: No rashes, lesions or ulcers Psychiatry: Judgement and insight appear normal. Mood & affect appropriate.    Data Reviewed:    Labs: Basic Metabolic Panel: Recent Labs  Lab 08/27/19 2030 08/29/19 0800 08/30/19 0625  NA 130* 137 137  K 3.1* 4.3 3.6  CL 100 103 101  CO2 15* 23 23  GLUCOSE 301* 264* 247*  BUN 12 18 19   CREATININE 0.78 0.75 0.76  CALCIUM 9.1 8.8* 8.7*  MG 2.0  --   --   PHOS 3.0  --   --    GFR Estimated Creatinine Clearance: 157.1 mL/min (by C-G formula based on SCr of 0.76 mg/dL). Liver Function Tests: Recent Labs  Lab 08/28/19 0922 08/30/19 0625   AST 36 33  ALT 33 30  ALKPHOS 59 58  BILITOT 1.0 0.5  PROT 7.0 6.3*  ALBUMIN 3.2* 3.1*   No results for input(s): LIPASE, AMYLASE in the last 168 hours. No results for input(s): AMMONIA in the last 168 hours. Coagulation profile No results for input(s): INR, PROTIME in the last 168 hours. COVID-19 Labs  Recent Labs    08/28/19 0922 08/30/19 0625  DDIMER  --  0.58*  FERRITIN 442*  --   CRP 13.8* 2.3*    Lab Results  Component Value Date   SARSCOV2NAA Detected (A) 08/26/2019    CBC: Recent Labs  Lab 08/27/19 2030 08/29/19 0800 08/30/19 0625  WBC 4.0 4.6 7.1  NEUTROABS  --  3.7 5.9  HGB 15.6 15.0 15.4  HCT 44.3 44.6 45.0  MCV 84.4 86.4 85.6  PLT 193 269 342   Cardiac Enzymes: No results for input(s): CKTOTAL, CKMB, CKMBINDEX, TROPONINI in the last 168 hours. BNP (last 3 results) No results for input(s): PROBNP in the last 8760 hours. CBG: Recent Labs  Lab 08/30/19 1104 08/30/19 1713 08/30/19 1943 08/31/19 0034 08/31/19 0515  GLUCAP 312* 204* 217* 147* 165*   D-Dimer: Recent Labs    08/30/19 0625  DDIMER 0.58*   Hgb A1c: Recent Labs    08/29/19 0800  HGBA1C 9.9*   Lipid Profile: No results for input(s): CHOL, HDL, LDLCALC, TRIG, CHOLHDL, LDLDIRECT in the last 72 hours. Thyroid function studies: No results for input(s): TSH, T4TOTAL, T3FREE, THYROIDAB in the last 72 hours.  Invalid input(s): FREET3 Anemia work up: Recent Labs    08/28/19 0922  FERRITIN 442*   Sepsis Labs: Recent Labs  Lab 08/27/19 2030 08/27/19 2144 08/28/19 0922 08/29/19 0800 08/30/19 0625  PROCALCITON <0.10  --  <0.10  --   --   WBC 4.0  --   --  4.6 7.1  LATICACIDVEN  --  1.3  --   --   --    Microbiology Recent Results (from the past 240 hour(s))  Novel Coronavirus, NAA ([facility])     Status: Abnormal   Collection Time: 08/26/19 10:25 AM   Specimen: Nasopharyngeal(NP) swabs in vial transport medium   NASOPHARYNGE  TESTING  Result Value Ref Range Status    SARS-CoV-2, NAA Detected (A) Not Detected Final    Comment: Testing was performed using the cobas(R) SARS-CoV-2 test. This nucleic acid amplification test was developed and its performance characteristics determined by 08/28/19. Nucleic acid amplification tests include PCR and TMA. This test has not been FDA cleared or approved. This test has been authorized by FDA under an Emergency Use Authorization (EUA). This test is only  authorized for the duration of time the declaration that circumstances exist justifying the authorization of the emergency use of in vitro diagnostic tests for detection of SARS-CoV-2 virus and/or diagnosis of COVID-19 infection under section 564(b)(1) of the Act, 21 U.S.C. 656CLE-7(N) (1), unless the authorization is terminated or revoked sooner. When diagnostic testing is negative, the possibility of a false negative result should be considered in the context of a patient's recent exposures and the presence of clinical signs and symptoms consistent with COVID-19. An individual without symptoms  of COVID-19 and who is not shedding SARS-CoV-2 virus would expect to have a negative (not detected) result in this assay.   Blood Culture (routine x 2)     Status: None (Preliminary result)   Collection Time: 08/27/19  9:44 PM   Specimen: Left Antecubital; Blood  Result Value Ref Range Status   Specimen Description LEFT ANTECUBITAL  Final   Special Requests Blood Culture adequate volume  Final   Culture   Final    NO GROWTH 3 DAYS Performed at [facility], [address] [place], [place] [address]    Report Status PENDING  Incomplete  Blood Culture (routine x 2)     Status: None (Preliminary result)   Collection Time: 08/27/19  9:45 PM   Specimen: BLOOD LEFT HAND  Result Value Ref Range Status   Specimen Description BLOOD LEFT HAND  Final   Special Requests   Final    BOTTLES DRAWN AEROBIC AND ANAEROBIC Blood Culture adequate volume   Culture   Final      NO GROWTH 3 DAYS Performed at [facility], [address] [place], [place] [address]    Report Status PENDING  Incomplete     Medications:   . vitamin C  500 mg Oral Daily  . dexamethasone  6 mg Oral Q12H  . enoxaparin (LOVENOX) injection  65 mg Subcutaneous Q24H  . insulin aspart  0-20 Units Subcutaneous TID WC  . insulin aspart  0-5 Units Subcutaneous QHS  . insulin aspart  6 Units Subcutaneous TID WC  . insulin detemir  50 Units Subcutaneous BID  . pantoprazole  40 mg Oral BID  . zinc sulfate  220 mg Oral Daily   Continuous Infusions: . remdesivir 100 mg in NS 100 mL Stopped (08/30/19 1106)      LOS: 4 days   Marinda Elk  [facility]  08/31/2019, 7:13 AM

## 2019-09-01 LAB — COMPREHENSIVE METABOLIC PANEL
ALT: 28 U/L (ref 0–44)
AST: 30 U/L (ref 15–41)
Albumin: 3.2 g/dL — ABNORMAL LOW (ref 3.5–5.0)
Alkaline Phosphatase: 61 U/L (ref 38–126)
Anion gap: 11 (ref 5–15)
BUN: 16 mg/dL (ref 6–20)
CO2: 26 mmol/L (ref 22–32)
Calcium: 8.4 mg/dL — ABNORMAL LOW (ref 8.9–10.3)
Chloride: 100 mmol/L (ref 98–111)
Creatinine, Ser: 0.81 mg/dL (ref 0.61–1.24)
GFR calc Af Amer: >60 mL/min (ref >60)
GFR calc non Af Amer: >60 mL/min (ref >60)
Glucose, Bld: 141 mg/dL — ABNORMAL HIGH (ref 70–99)
Potassium: 3.1 mmol/L — ABNORMAL LOW (ref 3.5–5.1)
Sodium: 137 mmol/L (ref 135–145)
Total Bilirubin: 0.8 mg/dL (ref 0.3–1.2)
Total Protein: 6.1 g/dL — ABNORMAL LOW (ref 6.5–8.1)

## 2019-09-01 LAB — CBC WITH DIFFERENTIAL/PLATELET
Abs Immature Granulocytes: 0.28 10*3/uL — ABNORMAL HIGH (ref 0.00–0.07)
Basophils Absolute: 0.1 10*3/uL (ref 0.0–0.1)
Basophils Relative: 1 %
Eosinophils Absolute: 0 10*3/uL (ref 0.0–0.5)
Eosinophils Relative: 0 %
HCT: 45.7 % (ref 39.0–52.0)
Hemoglobin: 15.4 g/dL (ref 13.0–17.0)
Immature Granulocytes: 5 %
Lymphocytes Relative: 10 %
Lymphs Abs: 0.6 10*3/uL — ABNORMAL LOW (ref 0.7–4.0)
MCH: 29.1 pg (ref 26.0–34.0)
MCHC: 33.7 g/dL (ref 30.0–36.0)
MCV: 86.2 fL (ref 80.0–100.0)
Monocytes Absolute: 0.5 10*3/uL (ref 0.1–1.0)
Monocytes Relative: 7 %
Neutro Abs: 4.9 10*3/uL (ref 1.7–7.7)
Neutrophils Relative %: 77 %
Platelets: 348 10*3/uL (ref 150–400)
RBC: 5.3 MIL/uL (ref 4.22–5.81)
RDW: 12.6 % (ref 11.5–15.5)
WBC: 6.2 10*3/uL (ref 4.0–10.5)
nRBC: 0 % (ref 0.0–0.2)

## 2019-09-01 LAB — D-DIMER, QUANTITATIVE: D-Dimer, Quant: 0.71 ug/mL-FEU — ABNORMAL HIGH (ref 0.00–0.50)

## 2019-09-01 LAB — GLUCOSE, CAPILLARY
Glucose-Capillary: 130 mg/dL — ABNORMAL HIGH (ref 70–99)
Glucose-Capillary: 169 mg/dL — ABNORMAL HIGH (ref 70–99)
Glucose-Capillary: 172 mg/dL — ABNORMAL HIGH (ref 70–99)
Glucose-Capillary: 154 mg/dL — ABNORMAL HIGH (ref 70–99)

## 2019-09-01 LAB — C-REACTIVE PROTEIN: CRP: 0.7 mg/dL (ref <1.0)

## 2019-09-01 MED ORDER — POTASSIUM CHLORIDE CRYS ER 20 MEQ PO TBCR: 20.0000 meq | EXTENDED_RELEASE_TABLET | Freq: Two times a day (BID) | ORAL | Status: DC

## 2019-09-01 MED ORDER — SALINE SPRAY 0.65 % NA SOLN
1.0000 | NASAL | Status: DC | PRN
  Administered 2019-09-01 – 2019-09-15 (×4): 1 via NASAL
  Filled 2019-09-01 (×2): qty 44

## 2019-09-01 MED ORDER — TOCILIZUMAB 400 MG/20ML IV SOLN
800.0000 mg | Freq: Once | INTRAVENOUS | Status: AC
  Administered 2019-09-01: 800 mg via INTRAVENOUS
  Filled 2019-09-01: qty 40

## 2019-09-01 NOTE — Progress Notes (Signed)
[facility] PROGRESS NOTE    Progress Note  Henry Miller  ION:[id number] DOB: 12-23-1970 DOA: 08/27/2019 PCP: Patient, No Pcp Per     Brief Narrative:   Henry Miller is an 49 y.o. male past medical history of prediabetes, DVT PE presents with a 1 day history of worsening shortness of breath he relates he tested positive for SARS-CoV-2 on 08/26/2019.  Prior to this he had been having mild symptoms of nonproductive cough and a low-grade fever accompanied by nausea vomiting diarrhea.  Checked his saturations at home and was 86% on room air, EMS placed him in 6 L and improved to 89 found tachycardic in the ED and satting 89% on 6 L we will start on remdesivir and steroids oxygen supplementation was increased to 15 L and saturations improved.  Actemra has been given  Assessment/Plan:   Acute respiratory failure with hypoxia due to Pneumonia due to COVID-19 virus He is persistently requiring 15 L of oxygen keep saturations greater than 96%. Inflammatory markers are significantly improved.  Continue IV remdesivir and steroids he status post Actemra on 08/28/2019, however  Will give another dose of Actemra as he continues to require 15 L of oxygen today and not speaking in full sentences. The treatment plan and use of medications and known side effects were discussed with patient/family, they were clearly explained that there is no proven definitive treatment for COVID-19 infection, any medications used here are based on published clinical articles/anecdotal data which are not peer-reviewed or randomized control trials.  Complete risks and long-term side effects are unknown, however in the best clinical judgment they seem to be of some clinical benefit rather than medical risks.  Patient/family agree with the treatment plan and want to receive the given medications. Try to keep the patient peripherally 16 hours a day, if not prone out of bed to chair. Patient continues to speak in full  sentences he has been doing a great job proning. Continues into spirometry and flutter valve.  Type 2 diabetes mellitus (Henry Miller) With an A1c of 9.9, he was started on long-acting insulin plus sliding scale resistant, his blood glucose coming down very nicely.  We will continue to check CBGs before meals and at bedtime.  Morbid obesity with BMI of 40.0-44.9, adult (Henry Miller) Counseling.  Hypokalemia: Has been repleted we will continues to be low we will replete again recheck in the morning.     DVT prophylaxis: lovenox Family Communication:none Disposition Plan/Barrier to D/C: unable to determine Code Status:     Code Status Orders  (From admission, onward)         Start     Ordered   08/28/19 0044  Full code  Continuous     08/28/19 0043        Code Status History    This patient has a current code status but no historical code status.   Advance Care Planning Activity        IV Access:    Peripheral IV   Procedures and diagnostic studies:   No results found.   Medical Consultants:    None.  Anti-Infectives:   IV remdesivir  Subjective:    Henry Miller he relates his breathing is about the same as yesterday, no appetite this morning.  Objective:    Vitals:   08/31/19 0800 08/31/19 1300 08/31/19 1700 08/31/19 1933  BP: 111/78 117/75 113/78 98/64  Pulse: 95 89 77 87  Resp: (!) 23 18 (!) 22 20  Temp:  98 F (36.7 C) 97.9 F (36.6 C) 97.8 F (36.6 C) (!) 97.5 F (36.4 C)  TempSrc: Oral Oral Oral Axillary  SpO2: 90% 100% 95% 91%  Weight:      Height:       SpO2: 91 % O2 Flow Rate (L/min): 15 L/min   Intake/Output Summary (Last 24 hours) at 09/01/2019 0744 Last data filed at 08/31/2019 1400 Gross per 24 hour  Intake 200 ml  Output 600 ml  Net -400 ml   Filed Weights   08/27/19 1945  Weight: 133.8 kg    Exam: General exam: In no acute distress. Respiratory system: Good air movement and diffuse crackles bilaterally. Cardiovascular  system: S1 & S2 heard, RRR. No JVD. Gastrointestinal system: Abdomen is nondistended, soft and nontender.  Central nervous system: Alert and oriented. No focal neurological deficits. Extremities: No pedal edema. Skin: No rashes, lesions or ulcers Psychiatry: Judgement and insight appear normal. Mood & affect appropriate.   Data Reviewed:    Labs: Basic Metabolic Panel: Recent Labs  Lab 08/27/19 2030 08/29/19 0800 08/30/19 0625 08/31/19 0550 09/01/19 0011  NA 130* 137 137 136 137  K 3.1* 4.3 3.6 3.3* 3.1*  CL 100 103 101 99 100  CO2 15* 23 23 26 26   GLUCOSE 301* 264* 247* 169* 141*  BUN 12 18 19 18 16   CREATININE 0.78 0.75 0.76 0.69 0.81  CALCIUM 9.1 8.8* 8.7* 8.4* 8.4*  MG 2.0  --   --   --   --   PHOS 3.0  --   --   --   --    GFR Estimated Creatinine Clearance: 155.2 mL/min (by C-G formula based on SCr of 0.81 mg/dL). Liver Function Tests: Recent Labs  Lab 08/28/19 0922 08/30/19 0625 08/31/19 0550 09/01/19 0011  AST 36 33 29 30  ALT 33 30 28 28   ALKPHOS 59 58 58 61  BILITOT 1.0 0.5 0.8 0.8  PROT 7.0 6.3* 5.9* 6.1*  ALBUMIN 3.2* 3.1* 3.2* 3.2*   No results for input(s): LIPASE, AMYLASE in the last 168 hours. No results for input(s): AMMONIA in the last 168 hours. Coagulation profile No results for input(s): INR, PROTIME in the last 168 hours. COVID-19 Labs  Recent Labs    08/30/19 0625 08/31/19 0550 09/01/19 0011  DDIMER 0.58* 0.64* 0.71*  CRP 2.3* 1.0* 0.7    Lab Results  Component Value Date   SARSCOV2NAA Detected (A) 08/26/2019    CBC: Recent Labs  Lab 08/27/19 2030 08/29/19 0800 08/30/19 0625 08/31/19 0550 09/01/19 0011  WBC 4.0 4.6 7.1 5.8 6.2  NEUTROABS  --  3.7 5.9 4.8 4.9  HGB 15.6 15.0 15.4 15.4 15.4  HCT 44.3 44.6 45.0 44.3 45.7  MCV 84.4 86.4 85.6 85.9 86.2  PLT 193 269 342 328 348   Cardiac Enzymes: No results for input(s): CKTOTAL, CKMB, CKMBINDEX, TROPONINI in the last 168 hours. BNP (last 3 results) No results for  input(s): PROBNP in the last 8760 hours. CBG: Recent Labs  Lab 08/31/19 0717 08/31/19 1136 08/31/19 1540 08/31/19 1939 09/01/19 0715  GLUCAP 162* 196* 248* 177* 130*   D-Dimer: Recent Labs    08/31/19 0550 09/01/19 0011  DDIMER 0.64* 0.71*   Hgb A1c: Recent Labs    08/29/19 0800  HGBA1C 9.9*   Lipid Profile: No results for input(s): CHOL, HDL, LDLCALC, TRIG, CHOLHDL, LDLDIRECT in the last 72 hours. Thyroid function studies: No results for input(s): TSH, T4TOTAL, T3FREE, THYROIDAB in the last 72 hours.  Invalid input(s): FREET3 Anemia work up: No results for input(s): VITAMINB12, FOLATE, FERRITIN, TIBC, IRON, RETICCTPCT in the last 72 hours. Sepsis Labs: Recent Labs  Lab 08/27/19 2030 08/27/19 2144 08/28/19 0922 08/29/19 0800 08/30/19 0625 08/31/19 0550 09/01/19 0011  PROCALCITON <0.10  --  <0.10  --   --   --   --   WBC 4.0  --   --  4.6 7.1 5.8 6.2  LATICACIDVEN  --  1.3  --   --   --   --   --    Microbiology Recent Results (from the past 240 hour(s))  Novel Coronavirus, NAA ([facility])     Status: Abnormal   Collection Time: 08/26/19 10:25 AM   Specimen: Nasopharyngeal(NP) swabs in vial transport medium   NASOPHARYNGE  TESTING  Result Value Ref Range Status   SARS-CoV-2, NAA Detected (A) Not Detected Final    Comment: Testing was performed using the cobas(R) SARS-CoV-2 test. This nucleic acid amplification test was developed and its performance characteristics determined by [employer]. Nucleic acid amplification tests include PCR and TMA. This test has not been FDA cleared or approved. This test has been authorized by FDA under an Emergency Use Authorization (EUA). This test is only authorized for the duration of time the declaration that circumstances exist justifying the authorization of the emergency use of in vitro diagnostic tests for detection of SARS-CoV-2 virus and/or diagnosis of COVID-19 infection under section 564(b)(1) of the Act,  21 U.S.C. 409WJX-9(J) (1), unless the authorization is terminated or revoked sooner. When diagnostic testing is negative, the possibility of a false negative result should be considered in the context of a patient's recent exposures and the presence of clinical signs and symptoms consistent with COVID-19. An individual without symptoms  of COVID-19 and who is not shedding SARS-CoV-2 virus would expect to have a negative (not detected) result in this assay.   Blood Culture (routine x 2)     Status: None (Preliminary result)   Collection Time: 08/27/19  9:44 PM   Specimen: Left Antecubital; Blood  Result Value Ref Range Status   Specimen Description LEFT ANTECUBITAL  Final   Special Requests Blood Culture adequate volume  Final   Culture   Final    NO GROWTH 4 DAYS Performed at [facility], [address], [place], [place] [address]    Report Status PENDING  Incomplete  Blood Culture (routine x 2)     Status: None (Preliminary result)   Collection Time: 08/27/19  9:45 PM   Specimen: BLOOD LEFT HAND  Result Value Ref Range Status   Specimen Description BLOOD LEFT HAND  Final   Special Requests   Final    BOTTLES DRAWN AEROBIC AND ANAEROBIC Blood Culture adequate volume   Culture   Final    NO GROWTH 4 DAYS Performed at [facility], [address] [place], [place] [address]    Report Status PENDING  Incomplete     Medications:   . vitamin C  500 mg Oral Daily  . dexamethasone  6 mg Oral Q12H  . enoxaparin (LOVENOX) injection  65 mg Subcutaneous Q24H  . insulin aspart  0-20 Units Subcutaneous TID WC  . insulin aspart  0-5 Units Subcutaneous QHS  . insulin aspart  6 Units Subcutaneous TID WC  . insulin detemir  50 Units Subcutaneous BID  . pantoprazole  40 mg Oral BID  . potassium chloride  20 mEq Oral BID  . zinc sulfate  220 mg Oral Daily  Continuous Infusions: . remdesivir 100 mg in NS 100 mL Stopped (08/31/19 1100)      LOS: 5 days   Marinda Elk  [facility]  09/01/2019, 7:44 AM

## 2019-09-02 LAB — CULTURE, BLOOD (ROUTINE X 2)
Culture: NO GROWTH
Culture: NO GROWTH
Special Requests: ADEQUATE
Special Requests: ADEQUATE

## 2019-09-02 LAB — GLUCOSE, CAPILLARY
Glucose-Capillary: 96 mg/dL (ref 70–99)
Glucose-Capillary: 104 mg/dL — ABNORMAL HIGH (ref 70–99)
Glucose-Capillary: 122 mg/dL — ABNORMAL HIGH (ref 70–99)
Glucose-Capillary: 195 mg/dL — ABNORMAL HIGH (ref 70–99)

## 2019-09-02 LAB — CBC WITH DIFFERENTIAL/PLATELET
Abs Immature Granulocytes: 0.39 10*3/uL — ABNORMAL HIGH (ref 0.00–0.07)
Basophils Absolute: 0.1 10*3/uL (ref 0.0–0.1)
Basophils Relative: 1 %
Eosinophils Absolute: 0 10*3/uL (ref 0.0–0.5)
Eosinophils Relative: 0 %
HCT: 45.2 % (ref 39.0–52.0)
Hemoglobin: 15.5 g/dL (ref 13.0–17.0)
Immature Granulocytes: 5 %
Lymphocytes Relative: 8 %
Lymphs Abs: 0.7 10*3/uL (ref 0.7–4.0)
MCH: 29.4 pg (ref 26.0–34.0)
MCHC: 34.3 g/dL (ref 30.0–36.0)
MCV: 85.8 fL (ref 80.0–100.0)
Monocytes Absolute: 0.3 10*3/uL (ref 0.1–1.0)
Monocytes Relative: 3 %
Neutro Abs: 6.5 10*3/uL (ref 1.7–7.7)
Neutrophils Relative %: 83 %
Platelets: 326 10*3/uL (ref 150–400)
RBC: 5.27 MIL/uL (ref 4.22–5.81)
RDW: 12.5 % (ref 11.5–15.5)
WBC: 7.9 10*3/uL (ref 4.0–10.5)
nRBC: 0 % (ref 0.0–0.2)

## 2019-09-02 MED ORDER — INSULIN DETEMIR 100 UNIT/ML ~~LOC~~ SOLN
40.0000 [IU] | Freq: Two times a day (BID) | SUBCUTANEOUS | Status: DC
  Administered 2019-09-02 – 2019-09-03 (×2): 40 [IU] via SUBCUTANEOUS
  Filled 2019-09-02 (×2): qty 0.4

## 2019-09-02 MED ORDER — POLYETHYLENE GLYCOL 3350 17 G PO PACK: 17.0000 g | PACK | Freq: Every day | ORAL | Status: DC | PRN

## 2019-09-02 MED ORDER — DEXAMETHASONE 6 MG PO TABS
6.0000 mg | ORAL_TABLET | Freq: Every day | ORAL | Status: DC
  Administered 2019-09-03 – 2019-09-05 (×3): 6 mg via ORAL
  Filled 2019-09-02 (×3): qty 1

## 2019-09-02 NOTE — Evaluation (Signed)
Physical Therapy Evaluation Patient Details Name: Henry Miller MRN: [id number] DOB: Jul 30, 1971 Today's Date: 09/02/2019   History of Present Illness  49 y.o. male admitted on 08/27/19 for acute respiratory failure with hypoxia due to COVID 19 viral PNA, hypokalemia.  Pt with significant PMH of DM2, morbid obesity.   Clinical Impression  Pt was able to transfer OOB to chair and preform standing marches with supervision for ~45 seconds.  He needed both NRB and HFNC at 15L each to maintain sats during mobility.  With only HFNC his sats would drop quickly into the low 70s and high 60s.  He rebounds in less than 5 mins if he wears both HFNC and NRB.  IS and FV reviewed x 10 reps each, more tolerable if split up into 2 sets of 5 reps.  PT will continue to follow acutely for safe mobility progression.    Follow Up Recommendations Home health PT;Supervision - Intermittent    Equipment Recommendations  Other (comment)(likely home O2)    Recommendations for Other Services OT consult     Precautions / Restrictions Precautions Precautions: Other (comment) Precaution Comments: 15L NRB, 15L HFNC      Mobility  Bed Mobility Overal bed mobility: Modified Independent                Transfers Overall transfer level: Modified independent               General transfer comment: transfers well, does better with O2 sats and tolerance with NRB mask donned  Ambulation/Gait             General Gait Details: marched in place as he will need two O2 tanks to walk safely at this time.          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     Hand Dominance   Dominant Hand: Right    Extremity/Trunk Assessment   Upper Extremity Assessment Upper Extremity Assessment: Defer to OT evaluation    Lower Extremity Assessment Lower Extremity Assessment: Overall WFL for tasks assessed    Cervical / Trunk Assessment Cervical / Trunk Assessment: Normal  Communication   Communication: No difficulties  Cognition Arousal/Alertness: Awake/alert Behavior During Therapy: WFL for tasks assessed/performed Overall Cognitive Status: Within Functional Limits for tasks assessed                                        General Comments General comments (skin integrity, edema, etc.): 15L HFNC, and NRB 15L O2 sats decreased most with coughing episodes to 67%, blowing his nose, and when he takes NRB off, he desats to upper 70s and low 80s, so  encouraged to keep it donned for now. RR up to the 40s.    Exercises Other Exercises Other Exercises: IS up to 1400 x 10 reps (would do better with 5 reps x 2 next time Other Exercises: FV 2 x 5 reps, NRB mask to recover.   Assessment/Plan    PT Assessment Patient needs continued PT services  PT Problem List Decreased activity tolerance;Decreased mobility;Decreased knowledge of precautions;Cardiopulmonary status limiting activity;Obesity       PT Treatment Interventions DME instruction;Gait training;Stair training;Functional mobility training;Therapeutic activities;Therapeutic exercise;Balance training;Patient/family education    PT Goals (Current goals can be found in the Care Plan section)  Acute Rehab PT Goals Patient Stated Goal: to get home and get better so he can get back to work and his hobbies PT Goal  Formulation: With patient Time For Goal Achievement: 09/16/19 Potential to Achieve Goals: Good    Frequency Min 3X/week           AM-PAC PT "6 Clicks" Mobility  Outcome Measure Help needed turning from your back to your side while in a flat bed without using bedrails?: None Help needed moving from lying on your back to sitting on the side of a flat bed without using bedrails?: None Help needed moving to and from a bed to a chair (including a wheelchair)?: None Help needed standing up from a chair using your arms (e.g., wheelchair or bedside chair)?: None Help needed to walk in hospital room?: A Little Help needed climbing 3-5 steps with a railing? : A Little 6 Click Score: 22    End of Session Equipment Utilized During Treatment: Oxygen Activity Tolerance: Patient limited by fatigue;Other (comment)(limited by DOE/high O2 demands) Patient left: in chair;with call bell/phone within reach Nurse Communication: Mobility status;Other (comment)(pt requesting robitussin) PT Visit Diagnosis: Difficulty in walking, not elsewhere classified (R26.2)    Time: 1609-1700 PT Time Calculation (min) (ACUTE ONLY): 51 min   Charges:           Corinna Capra, PT, DPT  Acute Rehabilitation [phone] pager #[phone] office  @ [facility]: [phone]   PT Evaluation $PT Eval Moderate Complexity: 1 Mod PT Treatments $Therapeutic Exercise: 8-22 mins $Therapeutic Activity: 8-22 mins        09/02/2019, 5:49 PM

## 2019-09-02 NOTE — Plan of Care (Signed)

## 2019-09-02 NOTE — Progress Notes (Signed)
[facility] PROGRESS NOTE    Progress Note  Henry Miller  EPP:[id number] DOB: 11/16/1970 DOA: 08/27/2019 PCP: Patient, No Pcp Per     Brief Narrative:   Henry Miller is an 49 y.o. male past medical history of prediabetes, DVT PE presents with a 1 day history of worsening shortness of breath he relates he tested positive for SARS-CoV-2 on 08/26/2019.  Prior to this he had been having mild symptoms of nonproductive cough and a low-grade fever accompanied by nausea vomiting diarrhea.  Checked his saturations at home and was 86% on room air, EMS placed him in 6 L and improved to 89 found tachycardic in the ED and satting 89% on 6 L we will start on remdesivir and steroids oxygen supplementation was increased to 15 L and saturations improved.  Actemra has been given  Assessment/Plan:   Acute respiratory failure with hypoxia due to Pneumonia due to COVID-19 virus Mr. Baird Lyons continues to require 15 L of oxygen to keep saturations greater than 88%. Continue IV remdesivir and steroids he is status post 2 doses of Actemra.  He continues to be not able to speak in full sentences. Try to keep the patient peripherally 16 hours a day if not prone out of bed to chair, continues into spirometry and flutter valve. Consult physical therapy. Cont to restrict his fluids.  Type 2 diabetes mellitus (HCC) With an A1c of 9.9, will decrease long-acting insulin as we will decrease his steroids.  Morbid obesity with BMI of 40.0-44.9, adult (HCC) Counseling.  Hypokalemia: Repleted basic metabolic panel is pending this morning.     DVT prophylaxis: lovenox Family Communication:none Disposition Plan/Barrier to D/C: unable to determine Code Status:     Code Status Orders  (From admission, onward)         Start     Ordered   08/28/19 0044  Full code  Continuous     08/28/19 0043        Code Status History    This patient has a current code status but no historical code status.   Advance  Care Planning Activity        IV Access:    Peripheral IV   Procedures and diagnostic studies:   No results found.   Medical Consultants:    None.  Anti-Infectives:   IV remdesivir  Subjective:    Henry Miller relates his breathing is better, his appetite has returned.  Objective:    Vitals:   09/01/19 2113 09/01/19 2300 09/02/19 0334 09/02/19 0723  BP: 106/73 110/73 116/79 101/75  Pulse: 90 74 76 95  Resp: 20 (!) 21 20 16   Temp: 98.7 F (37.1 C) (!) 97.5 F (36.4 C) 97.9 F (36.6 C) 97.6 F (36.4 C)  TempSrc: Oral Oral Oral Oral  SpO2: 97% 92% 91% 92%  Weight:      Height:       SpO2: 92 % O2 Flow Rate (L/min): 15 L/min   Intake/Output Summary (Last 24 hours) at 09/02/2019 0725 Last data filed at 09/01/2019 2113 Gross per 24 hour  Intake 100 ml  Output 600 ml  Net -500 ml   Filed Weights   08/27/19 1945  Weight: 133.8 kg    Exam: General exam: In no acute distress. Respiratory system: Good air movement and diffuse crackles bilaterally Cardiovascular system: S1 & S2 heard, RRR. No JVD. Gastrointestinal system: Abdomen is nondistended, soft and nontender.  Central nervous system: Alert and oriented. No focal neurological deficits. Extremities:  No pedal edema. Skin: No rashes, lesions or ulcers Psychiatry: Judgement and insight appear normal. Mood & affect appropriate.    Data Reviewed:    Labs: Basic Metabolic Panel: Recent Labs  Lab 08/27/19 2030 08/29/19 0800 08/30/19 0625 08/31/19 0550 09/01/19 0011  NA 130* 137 137 136 137  K 3.1* 4.3 3.6 3.3* 3.1*  CL 100 103 101 99 100  CO2 15* 23 23 26 26   GLUCOSE 301* 264* 247* 169* 141*  BUN 12 18 19 18 16   CREATININE 0.78 0.75 0.76 0.69 0.81  CALCIUM 9.1 8.8* 8.7* 8.4* 8.4*  MG 2.0  --   --   --   --   PHOS 3.0  --   --   --   --    GFR Estimated Creatinine Clearance: 155.2 mL/min (by C-G formula based on SCr of 0.81 mg/dL). Liver Function Tests: Recent Labs  Lab 08/28/19  0922 08/30/19 0625 08/31/19 0550 09/01/19 0011  AST 36 33 29 30  ALT 33 30 28 28   ALKPHOS 59 58 58 61  BILITOT 1.0 0.5 0.8 0.8  PROT 7.0 6.3* 5.9* 6.1*  ALBUMIN 3.2* 3.1* 3.2* 3.2*   No results for input(s): LIPASE, AMYLASE in the last 168 hours. No results for input(s): AMMONIA in the last 168 hours. Coagulation profile No results for input(s): INR, PROTIME in the last 168 hours. COVID-19 Labs  Recent Labs    08/31/19 0550 09/01/19 0011  DDIMER 0.64* 0.71*  CRP 1.0* 0.7    Lab Results  Component Value Date   SARSCOV2NAA Detected (A) 08/26/2019    CBC: Recent Labs  Lab 08/27/19 2030 08/29/19 0800 08/30/19 0625 08/31/19 0550 09/01/19 0011  WBC 4.0 4.6 7.1 5.8 6.2  NEUTROABS  --  3.7 5.9 4.8 4.9  HGB 15.6 15.0 15.4 15.4 15.4  HCT 44.3 44.6 45.0 44.3 45.7  MCV 84.4 86.4 85.6 85.9 86.2  PLT 193 269 342 328 348   Cardiac Enzymes: No results for input(s): CKTOTAL, CKMB, CKMBINDEX, TROPONINI in the last 168 hours. BNP (last 3 results) No results for input(s): PROBNP in the last 8760 hours. CBG: Recent Labs  Lab 08/31/19 1939 09/01/19 0715 09/01/19 1137 09/01/19 1630 09/01/19 2124  GLUCAP 177* 130* 169* 172* 154*   D-Dimer: Recent Labs    08/31/19 0550 09/01/19 0011  DDIMER 0.64* 0.71*   Hgb A1c: No results for input(s): HGBA1C in the last 72 hours. Lipid Profile: No results for input(s): CHOL, HDL, LDLCALC, TRIG, CHOLHDL, LDLDIRECT in the last 72 hours. Thyroid function studies: No results for input(s): TSH, T4TOTAL, T3FREE, THYROIDAB in the last 72 hours.  Invalid input(s): FREET3 Anemia work up: No results for input(s): VITAMINB12, FOLATE, FERRITIN, TIBC, IRON, RETICCTPCT in the last 72 hours. Sepsis Labs: Recent Labs  Lab 08/27/19 2030 08/27/19 2144 08/28/19 0922 08/29/19 0800 08/30/19 0625 08/31/19 0550 09/01/19 0011  PROCALCITON <0.10  --  <0.10  --   --   --   --   WBC 4.0  --   --  4.6 7.1 5.8 6.2  LATICACIDVEN  --  1.3  --   --    --   --   --    Microbiology Recent Results (from the past 240 hour(s))  Novel Coronavirus, NAA ([facility])     Status: Abnormal   Collection Time: 08/26/19 10:25 AM   Specimen: Nasopharyngeal(NP) swabs in vial transport medium   NASOPHARYNGE  TESTING  Result Value Ref Range Status   SARS-CoV-2, NAA Detected (A) Not  Detected Final    Comment: Testing was performed using the cobas(R) SARS-CoV-2 test. This nucleic acid amplification test was developed and its performance characteristics determined by [employer]. Nucleic acid amplification tests include PCR and TMA. This test has not been FDA cleared or approved. This test has been authorized by FDA under an Emergency Use Authorization (EUA). This test is only authorized for the duration of time the declaration that circumstances exist justifying the authorization of the emergency use of in vitro diagnostic tests for detection of SARS-CoV-2 virus and/or diagnosis of COVID-19 infection under section 564(b)(1) of the Act, 21 U.S.C. 284XLK-4(M) (1), unless the authorization is terminated or revoked sooner. When diagnostic testing is negative, the possibility of a false negative result should be considered in the context of a patient's recent exposures and the presence of clinical signs and symptoms consistent with COVID-19. An individual without symptoms  of COVID-19 and who is not shedding SARS-CoV-2 virus would expect to have a negative (not detected) result in this assay.   Blood Culture (routine x 2)     Status: None (Preliminary result)   Collection Time: 08/27/19  9:44 PM   Specimen: Left Antecubital; Blood  Result Value Ref Range Status   Specimen Description LEFT ANTECUBITAL  Final   Special Requests Blood Culture adequate volume  Final   Culture   Final    NO GROWTH 4 DAYS Performed at [facility], [address] [place], [place] [address]    Report Status PENDING  Incomplete  Blood Culture (routine x 2)      Status: None (Preliminary result)   Collection Time: 08/27/19  9:45 PM   Specimen: BLOOD LEFT HAND  Result Value Ref Range Status   Specimen Description BLOOD LEFT HAND  Final   Special Requests   Final    BOTTLES DRAWN AEROBIC AND ANAEROBIC Blood Culture adequate volume   Culture   Final    NO GROWTH 4 DAYS Performed at [facility], [address] [place], [place] [address]    Report Status PENDING  Incomplete     Medications:   . vitamin C  500 mg Oral Daily  . dexamethasone  6 mg Oral Q12H  . enoxaparin (LOVENOX) injection  65 mg Subcutaneous Q24H  . insulin aspart  0-20 Units Subcutaneous TID WC  . insulin aspart  0-5 Units Subcutaneous QHS  . insulin aspart  6 Units Subcutaneous TID WC  . insulin detemir  50 Units Subcutaneous BID  . pantoprazole  40 mg Oral BID  . zinc sulfate  220 mg Oral Daily   Continuous Infusions:     LOS: 6 days   Marinda Elk  [facility]  09/02/2019, 7:25 AM

## 2019-09-03 LAB — BASIC METABOLIC PANEL
Anion gap: 12 (ref 5–15)
BUN: 11 mg/dL (ref 6–20)
CO2: 23 mmol/L (ref 22–32)
Calcium: 8 mg/dL — ABNORMAL LOW (ref 8.9–10.3)
Chloride: 101 mmol/L (ref 98–111)
Creatinine, Ser: 0.73 mg/dL (ref 0.61–1.24)
GFR calc Af Amer: >60 mL/min (ref >60)
GFR calc non Af Amer: >60 mL/min (ref >60)
Glucose, Bld: 48 mg/dL — ABNORMAL LOW (ref 70–99)
Potassium: 2.4 mmol/L — CL (ref 3.5–5.1)
Sodium: 136 mmol/L (ref 135–145)

## 2019-09-03 LAB — MAGNESIUM: Magnesium: 2.1 mg/dL (ref 1.7–2.4)

## 2019-09-03 LAB — GLUCOSE, CAPILLARY
Glucose-Capillary: 75 mg/dL (ref 70–99)
Glucose-Capillary: 113 mg/dL — ABNORMAL HIGH (ref 70–99)
Glucose-Capillary: 140 mg/dL — ABNORMAL HIGH (ref 70–99)

## 2019-09-03 MED ORDER — MAGNESIUM OXIDE 400 (241.3 MG) MG PO TABS
400.0000 mg | ORAL_TABLET | Freq: Two times a day (BID) | ORAL | Status: AC
  Administered 2019-09-03 (×2): 400 mg via ORAL
  Filled 2019-09-03 (×2): qty 1

## 2019-09-03 MED ORDER — POTASSIUM CHLORIDE CRYS ER 20 MEQ PO TBCR
40.0000 meq | EXTENDED_RELEASE_TABLET | Freq: Three times a day (TID) | ORAL | Status: AC
  Administered 2019-09-03 (×3): 40 meq via ORAL
  Filled 2019-09-03 (×3): qty 2

## 2019-09-03 MED ORDER — INSULIN DETEMIR 100 UNIT/ML ~~LOC~~ SOLN
20.0000 [IU] | Freq: Two times a day (BID) | SUBCUTANEOUS | Status: DC
  Administered 2019-09-03: 20 [IU] via SUBCUTANEOUS
  Filled 2019-09-03 (×2): qty 0.2

## 2019-09-03 NOTE — Plan of Care (Signed)

## 2019-09-03 NOTE — Progress Notes (Addendum)
[facility] PROGRESS NOTE    Progress Note  Henry Miller  GYJ:[id number] DOB: 1970-11-18 DOA: 08/27/2019 PCP: Patient, No Pcp Per     Brief Narrative:   Henry Miller is an 49 y.o. male past medical history of prediabetes, DVT PE presents with a 1 day history of worsening shortness of breath he relates he tested positive for SARS-CoV-2 on 08/26/2019.  Prior to this he had been having mild symptoms of nonproductive cough and a low-grade fever accompanied by nausea vomiting diarrhea.  Checked his saturations at home and was 86% on room air, EMS placed him in 6 L and improved to 89 found tachycardic in the ED and satting 89% on 6 L we will start on remdesivir and steroids oxygen supplementation was increased to 15 L and saturations improved.  Actemra has been given  Assessment/Plan:   Acute respiratory failure with hypoxia due to Pneumonia due to COVID-19 virus Henry Miller continues to require 15 L of oxygen to keep saturations greater than 90%. IV remdesivir, will continue the medicine for a total of 10 days, he has also received 2 doses of IV Actemra. He is speaking in full sentences this morning, he relates his breathing is significantly better compared to 3 to 4 days ago. He is doing a great job trying to remain prone, if not prone out of bed to chair, continues incentive spirometry and flutter valve. His inflammatory markers are unremarkable. Check a CRP and D-dimer tomorrow. Henry Miller continues to require 15 L of oxygen to keep saturations greater than 88%. Continue IV remdesivir and steroids he is status post 2 doses of Actemra.  He continues to be not able to speak in full sentences. Try to keep the patient peripherally 16 hours a day if not prone out of bed to chair, continues into spirometry and flutter valve. Consult physical therapy. Cont to restrict his fluids.  Type 2 diabetes mellitus (HCC) With an A1c of 9.9, had an episode of low blood glucose this morning we will  decrease even further the long-acting insulin considering sliding scale.  Morbid obesity with BMI of 40.0-44.9, adult (HCC) Counseling.  Hypokalemia: Repleted basic metabolic panel is pending this morning.     DVT prophylaxis: lovenox Family Communication:none Disposition Plan/Barrier to D/C: unable to determine Code Status:     Code Status Orders  (From admission, onward)         Start     Ordered   08/28/19 0044  Full code  Continuous     08/28/19 0043        Code Status History    This patient has a current code status but no historical code status.   Advance Care Planning Activity        IV Access:    Peripheral IV   Procedures and diagnostic studies:   No results found.   Medical Consultants:    None.  Anti-Infectives:   IV remdesivir  Subjective:    Henry Miller relates his breathing is better, his appetite has returned.  Objective:    Vitals:   09/02/19 1607 09/02/19 2000 09/03/19 0000 09/03/19 0400  BP: 101/61 (!) 91/57 109/62 96/61  Pulse:  73 91 90  Resp: 19 (!) 25 (!) 24 (!) 29  Temp:  97.8 F (36.6 C) 98.3 F (36.8 C) 98.6 F (37 C)  TempSrc:  Oral Oral Oral  SpO2:  95% 93% (!) 88%  Weight:      Height:  SpO2: (!) 88 % O2 Flow Rate (L/min): 15 L/min  No intake or output data in the 24 hours ending 09/03/19 0659 Filed Weights   08/27/19 1945  Weight: 133.8 kg    Exam: General exam: In no acute distress. Respiratory system: Good air movement and diffuse crackles bilaterally. Cardiovascular system: S1 & S2 heard, RRR. No JVD. Gastrointestinal system: Abdomen is nondistended, soft and nontender.  Central nervous system: Alert and oriented. No focal neurological deficits. Extremities: No pedal edema. Skin: No rashes, lesions or ulcers Psychiatry: Judgement and insight appear normal. Mood & affect appropriate.   Data Reviewed:    Labs: Basic Metabolic Panel: Recent Labs  Lab 08/27/19 2030 08/29/19 0800  08/30/19 0625 08/31/19 0550 09/01/19 0011  NA 130* 137 137 136 137  K 3.1* 4.3 3.6 3.3* 3.1*  CL 100 103 101 99 100  CO2 15* 23 23 26 26   GLUCOSE 301* 264* 247* 169* 141*  BUN 12 18 19 18 16   CREATININE 0.78 0.75 0.76 0.69 0.81  CALCIUM 9.1 8.8* 8.7* 8.4* 8.4*  MG 2.0  --   --   --   --   PHOS 3.0  --   --   --   --    GFR Estimated Creatinine Clearance: 155.2 mL/min (by C-G formula based on SCr of 0.81 mg/dL). Liver Function Tests: Recent Labs  Lab 08/28/19 0922 08/30/19 0625 08/31/19 0550 09/01/19 0011  AST 36 33 29 30  ALT 33 30 28 28   ALKPHOS 59 58 58 61  BILITOT 1.0 0.5 0.8 0.8  PROT 7.0 6.3* 5.9* 6.1*  ALBUMIN 3.2* 3.1* 3.2* 3.2*   No results for input(s): LIPASE, AMYLASE in the last 168 hours. No results for input(s): AMMONIA in the last 168 hours. Coagulation profile No results for input(s): INR, PROTIME in the last 168 hours. COVID-19 Labs  Recent Labs    09/01/19 0011  DDIMER 0.71*  CRP 0.7    Lab Results  Component Value Date   SARSCOV2NAA Detected (A) 08/26/2019    CBC: Recent Labs  Lab 08/29/19 0800 08/30/19 0625 08/31/19 0550 09/01/19 0011 09/02/19 0435  WBC 4.6 7.1 5.8 6.2 7.9  NEUTROABS 3.7 5.9 4.8 4.9 6.5  HGB 15.0 15.4 15.4 15.4 15.5  HCT 44.6 45.0 44.3 45.7 45.2  MCV 86.4 85.6 85.9 86.2 85.8  PLT 269 342 328 348 326   Cardiac Enzymes: No results for input(s): CKTOTAL, CKMB, CKMBINDEX, TROPONINI in the last 168 hours. BNP (last 3 results) No results for input(s): PROBNP in the last 8760 hours. CBG: Recent Labs  Lab 09/01/19 2124 09/02/19 0721 09/02/19 1135 09/02/19 1603 09/02/19 1929  GLUCAP 154* 96 104* 122* 195*   D-Dimer: Recent Labs    09/01/19 0011  DDIMER 0.71*   Hgb A1c: No results for input(s): HGBA1C in the last 72 hours. Lipid Profile: No results for input(s): CHOL, HDL, LDLCALC, TRIG, CHOLHDL, LDLDIRECT in the last 72 hours. Thyroid function studies: No results for input(s): TSH, T4TOTAL, T3FREE,  THYROIDAB in the last 72 hours.  Invalid input(s): FREET3 Anemia work up: No results for input(s): VITAMINB12, FOLATE, FERRITIN, TIBC, IRON, RETICCTPCT in the last 72 hours. Sepsis Labs: Recent Labs  Lab 08/27/19 2030 08/27/19 2144 08/28/19 0922 08/30/19 0625 08/31/19 0550 09/01/19 0011 09/02/19 0435  PROCALCITON <0.10  --  <0.10  --   --   --   --   WBC 4.0  --   --  7.1 5.8 6.2 7.9  LATICACIDVEN  --  1.3  --   --   --   --   --    Microbiology Recent Results (from the past 240 hour(s))  Novel Coronavirus, NAA ([facility])     Status: Abnormal   Collection Time: 08/26/19 10:25 AM   Specimen: Nasopharyngeal(NP) swabs in vial transport medium   NASOPHARYNGE  TESTING  Result Value Ref Range Status   SARS-CoV-2, NAA Detected (A) Not Detected Final    Comment: Testing was performed using the cobas(R) SARS-CoV-2 test. This nucleic acid amplification test was developed and its performance characteristics determined by [employer]. Nucleic acid amplification tests include PCR and TMA. This test has not been FDA cleared or approved. This test has been authorized by FDA under an Emergency Use Authorization (EUA). This test is only authorized for the duration of time the declaration that circumstances exist justifying the authorization of the emergency use of in vitro diagnostic tests for detection of SARS-CoV-2 virus and/or diagnosis of COVID-19 infection under section 564(b)(1) of the Act, 21 U.S.C. 947MLY-6(T) (1), unless the authorization is terminated or revoked sooner. When diagnostic testing is negative, the possibility of a false negative result should be considered in the context of a patient's recent exposures and the presence of clinical signs and symptoms consistent with COVID-19. An individual without symptoms  of COVID-19 and who is not shedding SARS-CoV-2 virus would expect to have a negative (not detected) result in this assay.   Blood Culture (routine x 2)      Status: None   Collection Time: 08/27/19  9:44 PM   Specimen: Left Antecubital; Blood  Result Value Ref Range Status   Specimen Description LEFT ANTECUBITAL  Final   Special Requests Blood Culture adequate volume  Final   Culture   Final    NO GROWTH 6 DAYS Performed at [facility], [address], [place], [place] [address]    Report Status 09/02/2019 FINAL  Final  Blood Culture (routine x 2)     Status: None   Collection Time: 08/27/19  9:45 PM   Specimen: BLOOD LEFT HAND  Result Value Ref Range Status   Specimen Description BLOOD LEFT HAND  Final   Special Requests   Final    BOTTLES DRAWN AEROBIC AND ANAEROBIC Blood Culture adequate volume   Culture   Final    NO GROWTH 6 DAYS Performed at [facility], [address] [place], [place] [address]    Report Status 09/02/2019 FINAL  Final     Medications:   . vitamin C  500 mg Oral Daily  . dexamethasone  6 mg Oral Daily  . enoxaparin (LOVENOX) injection  65 mg Subcutaneous Q24H  . insulin aspart  0-20 Units Subcutaneous TID WC  . insulin aspart  0-5 Units Subcutaneous QHS  . insulin aspart  6 Units Subcutaneous TID WC  . insulin detemir  40 Units Subcutaneous BID  . pantoprazole  40 mg Oral BID  . zinc sulfate  220 mg Oral Daily   Continuous Infusions:     LOS: 7 days   Charlynne Cousins  [facility]  09/03/2019, 6:59 AM

## 2019-09-03 NOTE — Progress Notes (Signed)
Physical Therapy Treatment Patient Details Name: Henry Miller MRN: [id number] DOB: [birth year] Today's Date: 09/03/2019    History of Present Illness 49 y.o. male admitted on 08/27/19 for acute respiratory failure with hypoxia due to COVID 19 viral PNA, hypokalemia.  Pt with significant PMH of DM2, morbid obesity.     PT Comments    Pt started session better than yesterday, standing to march x 2 for ~45 seconds with 10 L HFNC and 15 L NRB mask for activity tolerance.  Pt only dropped to the low 80s and rebounded within 3-5 mins to the 90s, stood and marched again with similar results.  HR in the 100s RR in the 30-40s.  Pt started coughing after doing flutter valve x 5 and had a hard time stopping his cough.  RN brought robitussin and it took ~15 mins for him to stop coughing spells, decreased RR and recover his breathing.  I turned him up to 15 and 15 on both devices to help him recover.  He returned to side lying and was able to settle before PT left room.  I messaged OT and asked if she would come in the afternoon to give him a chance to rest and also asked for a geomat cushion to be brought up to help his tolerance of the chair (he says it hurts his bottom).  PT will continue to follow acutely for safe mobility progression.  Follow Up Recommendations  Home health PT;Supervision - Intermittent     Equipment Recommendations  Other (comment)(likely home O2)    Recommendations for Other Services OT consult     Precautions / Restrictions Precautions Precautions: Other (comment) Precaution Comments: 15L NRB, 10-15L HFNC    Mobility  Bed Mobility Overal bed mobility: Modified Independent                Transfers Overall transfer level: Modified independent               General transfer comment: transfers well, does better with O2 sats and tolerance with NRB mask donned  Ambulation/Gait             General Gait Details: marched in place x 2 for ~45 seconds each.   O2 sats dropped to low 80s and took ~3-5 mins to recover, however, at the end of the session he started to have coughing fits that he could not stop and had a very hard time recovering from (needed to return to reclined position in bed on L side, increased O2 to 15L both devices, and RN brought robitussin.  Pt did eventually recover his breathing (RR and O2 sats) after ~15 mins.            Balance Overall balance assessment: No apparent balance deficits (not formally assessed)                                          Cognition Arousal/Alertness: Awake/alert Behavior During Therapy: WFL for tasks assessed/performed Overall Cognitive Status: Within Functional Limits for tasks assessed                                 General Comments: Pt very fatigued, not sleeping well.       Exercises Other Exercises Other Exercises: FV x 5 reps only as he started coughing and had a hard  time stopping.     General Comments General comments (skin integrity, edema, etc.): Brought in mobility tech Dalton for education on HFNC and NRB mask as Freida Busman can help mobilize him more frequently.       Pertinent Vitals/Pain Pain Assessment: No/denies pain           PT Goals (current goals can now be found in the care plan section) Acute Rehab PT Goals Patient Stated Goal: to get home and get better so he can get back to work and his hobbies Progress towards PT goals: Progressing toward goals    Frequency    Min 3X/week      PT Plan Current plan remains appropriate       AM-PAC PT "6 Clicks" Mobility   Outcome Measure  Help needed turning from your back to your side while in a flat bed without using bedrails?: None Help needed moving from lying on your back to sitting on the side of a flat bed without using bedrails?: None Help needed moving to and from a bed to a chair (including a wheelchair)?: None Help needed standing up from a chair using your arms (e.g.,  wheelchair or bedside chair)?: None Help needed to walk in hospital room?: A Little Help needed climbing 3-5 steps with a railing? : A Little 6 Click Score: 22    End of Session Equipment Utilized During Treatment: Oxygen Activity Tolerance: Patient limited by fatigue;Other (comment)(limited by DOE/high O2 demands) Patient left: in bed;Other (comment)(he had already been up to the chair.  ) Nurse Communication: Mobility status;Other (comment)(pt requesting robitussin) PT Visit Diagnosis: Difficulty in walking, not elsewhere classified (R26.2)     Time: 0102-7253 PT Time Calculation (min) (ACUTE ONLY): 60 min  Charges:  $Therapeutic Activity: 53-67 mins                    Corinna Capra, PT, DPT  Acute Rehabilitation [phone] pager #[phone] office  @ [facility]: [phone]   09/03/2019, 10:59 AM

## 2019-09-03 NOTE — Evaluation (Signed)
Occupational Therapy Treatment Patient Details Name: Henry Miller MRN: [id number] DOB: [birth year] Today's Date: 09/03/2019    History of present illness 49 y.o. male admitted on 08/27/19 for acute respiratory failure with hypoxia due to COVID 19 viral PNA, hypokalemia.  Pt with significant PMH of DM2, morbid obesity.    OT comments  PTA, pt was living with his wife and two children and was independent and working full time. Pt currently performing LB at [facility] A level and functional mobility with Supervision. Pt with limited activity tolerance as seen by fatigue and dropping SpO2 with minimal movement. Performing toileting at [facility] with Supervision and SpO2 dropping to 66% on 10L via HFNC and 15L via NRB. Pt also blowing his nose and SpO2 dropping to to 60%; encouraging pt to maintain NRB. Recommend dc to home once medically stable per physician. Will continue to follow acutely as admitted.    Follow Up Recommendations  No OT follow up;Supervision - Intermittent    Equipment Recommendations  3 in 1 bedside commode    Recommendations for Other Services PT consult    Precautions / Restrictions Precautions Precautions: Other (comment) Precaution Comments: 15L NRB, 10L HFNC Restrictions Weight Bearing Restrictions: No       Mobility Bed Mobility               General bed mobility comments: Pt sitting at EOB upon arrival  Transfers Overall transfer level: Needs assistance   Transfers: Sit to/from Stand Sit to Stand: Supervision         General transfer comment: Supervision for safety    Balance Overall balance assessment: No apparent balance deficits (not formally assessed)                                         ADL either performed or assessed with clinical judgement   ADL Overall ADL's : Needs assistance/impaired Eating/Feeding: Set up;Sitting   Grooming: Wash/dry hands;Set up;Supervision/safety;Sitting   Upper Body Bathing: Set  up;Supervision/ safety;Sitting   Lower Body Bathing: Supervison/ safety;Sit to/from stand   Upper Body Dressing : Set up;Supervision/safety;Sitting   Lower Body Dressing: Minimal assistance;Sit to/from stand   Toilet Transfer: BSC;Supervision/safety;Ambulation   Toileting- Clothing Manipulation and Hygiene: Supervision/safety;Sit to/from stand       Functional mobility during ADLs: Supervision/safety General ADL Comments: Pt presenting with poor activity tolerance and requiring significant rest break to recover SpO2. Performing ADLs at Supervision-Min A level.      Vision Baseline Vision/History: Wears glasses Wears Glasses: At all times Patient Visual Report: No change from baseline     Perception     Praxis      Cognition Arousal/Alertness: Awake/alert Behavior During Therapy: WFL for tasks assessed/performed Overall Cognitive Status: Within Functional Limits for tasks assessed                                 General Comments: Very motivated to participate despite fatigue, not sleeping well.         Exercises     Shoulder Instructions       General Comments SpO2 dropping to 66% with minimal activity. Pt requiring 15L via NRB and 10L via HFNC with seated rest break to recover to 88%. Pt taking NRB off to blow his nose, SpO2 dropping to 60s and he required 10L HFNC and 15L  NRB to return to 80s. RR elevating to 40s    Pertinent Vitals/ Pain       Pain Assessment: No/denies pain  Home Living Family/patient expects to be discharged to:: Private residence Living Arrangements: Spouse/significant other;Children(wife and daughter) Available Help at Discharge: Family;Available 24 hours/day Type of Home: House       Home Layout: Two level;1/2 bath on main level;Bed/bath upstairs Alternate Level Stairs-Number of Steps: flight   Bathroom Shower/Tub: Chief Strategy Officer: Standard     Home Equipment: None   Additional Comments: two cat  and dog      Prior Functioning/Environment Level of Independence: Independent        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        Progress Toward Goals  OT Goals(current goals can now be found in the care plan section)     Acute Rehab OT Goals Patient Stated Goal: to get home and get better so he can get back to work and his hobbies OT Goal Formulation: With patient Time For Goal Achievement: 09/17/19 Potential to Achieve Goals: Good  Plan      Co-evaluation                 AM-PAC OT "6 Clicks" Daily Activity     Outcome Measure   Help from another person eating meals?: None Help from another person taking care of personal grooming?: None Help from another person toileting, which includes using toliet, bedpan, or urinal?: A Little Help from another person bathing (including washing, rinsing, drying)?: A Little Help from another person to put on and taking off regular upper body clothing?: None Help from another person to put on and taking off regular lower body clothing?: A Little 6 Click Score: 21    End of Session Equipment Utilized During Treatment: Oxygen(10L HFNC and 15L NBR)  OT Visit Diagnosis: Unsteadiness on feet (R26.81);Other abnormalities of gait and mobility (R26.89);Muscle weakness (generalized) (M62.81)   Activity Tolerance Patient tolerated treatment well(Limited by SpO2)   Patient Left in chair;with call bell/phone within reach   Nurse Communication Mobility status        Time: 4481-8563 OT Time Calculation (min): 25 min  Charges: OT General Charges $OT Visit: 1 Visit OT Evaluation $OT Eval Moderate Complexity: 1 Mod OT Treatments $Self Care/Home Management : 8-22 mins  Henry Miller MSOT, OTR/L Acute Rehab Pager: [phone] Office: [phone]   Henry Miller 09/03/2019, 4:26 PM

## 2019-09-04 ENCOUNTER — Inpatient Hospital Stay (HOSPITAL_COMMUNITY): Payer: Commercial Managed Care - PPO

## 2019-09-04 DIAGNOSIS — U071 COVID-19: Secondary | ICD-10-CM

## 2019-09-04 DIAGNOSIS — M7989 Other specified soft tissue disorders: Secondary | ICD-10-CM

## 2019-09-04 DIAGNOSIS — J1282 Pneumonia due to coronavirus disease 2019: Secondary | ICD-10-CM

## 2019-09-04 DIAGNOSIS — R7989 Other specified abnormal findings of blood chemistry: Secondary | ICD-10-CM

## 2019-09-04 LAB — C-REACTIVE PROTEIN: CRP: 0.5 mg/dL (ref <1.0)

## 2019-09-04 LAB — BASIC METABOLIC PANEL
Anion gap: 13 (ref 5–15)
BUN: 9 mg/dL (ref 6–20)
CO2: 26 mmol/L (ref 22–32)
Calcium: 8.4 mg/dL — ABNORMAL LOW (ref 8.9–10.3)
Chloride: 101 mmol/L (ref 98–111)
Creatinine, Ser: 0.77 mg/dL (ref 0.61–1.24)
GFR calc Af Amer: >60 mL/min (ref >60)
GFR calc non Af Amer: >60 mL/min (ref >60)
Glucose, Bld: 58 mg/dL — ABNORMAL LOW (ref 70–99)
Potassium: 3.4 mmol/L — ABNORMAL LOW (ref 3.5–5.1)
Sodium: 140 mmol/L (ref 135–145)

## 2019-09-04 LAB — GLUCOSE, CAPILLARY
Glucose-Capillary: 171 mg/dL — ABNORMAL HIGH (ref 70–99)
Glucose-Capillary: 76 mg/dL (ref 70–99)
Glucose-Capillary: 116 mg/dL — ABNORMAL HIGH (ref 70–99)
Glucose-Capillary: 160 mg/dL — ABNORMAL HIGH (ref 70–99)
Glucose-Capillary: 152 mg/dL — ABNORMAL HIGH (ref 70–99)

## 2019-09-04 LAB — D-DIMER, QUANTITATIVE: D-Dimer, Quant: 5.06 ug/mL-FEU — ABNORMAL HIGH (ref 0.00–0.50)

## 2019-09-04 MED ORDER — POTASSIUM CHLORIDE CRYS ER 20 MEQ PO TBCR
40.0000 meq | EXTENDED_RELEASE_TABLET | Freq: Once | ORAL | Status: AC
  Administered 2019-09-04: 08:00:00 40 meq via ORAL
  Filled 2019-09-04: qty 2

## 2019-09-04 MED ORDER — FUROSEMIDE 10 MG/ML IJ SOLN
60.0000 mg | Freq: Once | INTRAMUSCULAR | Status: AC
  Administered 2019-09-04: 60 mg via INTRAVENOUS
  Filled 2019-09-04: qty 6

## 2019-09-04 MED ORDER — POTASSIUM CHLORIDE CRYS ER 20 MEQ PO TBCR
40.0000 meq | EXTENDED_RELEASE_TABLET | Freq: Once | ORAL | Status: AC
  Administered 2019-09-04: 40 meq via ORAL
  Filled 2019-09-04: qty 2

## 2019-09-04 MED ORDER — ENOXAPARIN SODIUM 80 MG/0.8ML ~~LOC~~ SOLN
65.0000 mg | Freq: Two times a day (BID) | SUBCUTANEOUS | Status: DC
  Administered 2019-09-04 – 2019-09-15 (×22): 65 mg via SUBCUTANEOUS
  Filled 2019-09-04 (×20): qty 0.8

## 2019-09-04 NOTE — Progress Notes (Addendum)
[facility] PROGRESS NOTE    Progress Note  Henry Miller  BJS:[id number] DOB: April 29, 1971 DOA: 08/27/2019 PCP: Patient, No Pcp Per     Brief Narrative:   Henry Miller is an 49 y.o. male past medical history of prediabetes, DVT PE presents with a 1 day history of worsening shortness of breath he relates he tested positive for SARS-CoV-2 on 08/26/2019.  Prior to this he had been having mild symptoms of nonproductive cough and a low-grade fever accompanied by nausea vomiting diarrhea.  Checked his saturations at home and was 86% on room air, EMS placed him in 6 L and improved to 89 found tachycardic in the ED and satting 89% on 6 L we will start on remdesivir and steroids oxygen supplementation was increased to 15 L and saturations improved.  Actemra has been given  Assessment/Plan:   Acute respiratory failure with hypoxia due to Pneumonia due to COVID-19 virus  Recent Labs  Lab 08/30/19 0625 08/31/19 0550 09/01/19 0011 09/04/19 0400  DDIMER 0.58* 0.64* 0.71* 5.06*  CRP 2.3* 1.0* 0.7 0.5  ALT 30 28 28   --     Patient was hospitalized and placed on remdesivir and steroids.  He was also given 2 doses of Actemra.  He continues to require 15 L of oxygen.  Incentive spirometry, prone positioning as much as tolerated, out of bed to chair.  D-dimer noted to be greater than 5 today.  Change the Lovenox dose to the intermediate dosage.  He had CT angiogram done at the time of admission which did not show any PE.  Lower extremity Doppler studies does not reveal any DVT.  Chest x-ray done this morning does show worsening in his bilateral infiltrates.  His WBC has been normal.  He is afebrile.  Do not suspect any secondary bacterial infection at this time.  Check WBC tomorrow along with a procalcitonin level.  We will give him Lasix to keep him on the dry side.  His CRP is normal.  Wait for him to improve.  Diabetes mellitus type 2, uncontrolled with hyperglycemia  HbA1c 9.9.  Due to  episode of hypoglycemia this morning Lantus has been discontinued.  Just SSI for now.  Continue to monitor.  Low blood glucose levels likely due to tapering of steroids.  Morbid obesity Estimated body mass index is 42.33 kg/m as calculated from the following:   Height as of this encounter: 5\' 10"  (1.778 m).   Weight as of this encounter: 133.8 kg.  Hypokalemia: This will be repleted.  Magnesium was normal yesterday.  Give additional dose later today as he will be given Lasix.       DVT prophylaxis: lovenox CODE STATUS: Full code Family Communication:none Disposition Plan: Hopefully return home when improved.    IV Access:    Peripheral IV   Procedures and diagnostic studies:   DG CHEST PORT 1 VIEW  Result Date: 09/04/2019 CLINICAL DATA:  Coronavirus infection.  Follow-up pneumonia. EXAM: PORTABLE CHEST 1 VIEW COMPARISON:  08/27/2019 FINDINGS: Heart is mildly enlarged. There is worsening of widespread patchy bilateral pulmonary infiltrates, more severe in the lower lungs. No dense consolidation, lobar collapse or pleural effusion. IMPRESSION: Worsening of widespread bilateral pulmonary infiltrates, lower lobe predominant. Electronically Signed   By: Nelson Chimes M.D.   On: 09/04/2019 10:29   LE Venous  Result Date: 09/04/2019  Lower Venous Study Indications: Swelling.  Risk Factors: COVID 19 positive. Limitations: Body habitus and poor ultrasound/tissue interface. Comparison Study: No prior studies.  Performing Technologist: Chanda Busing RVT  Examination Guidelines: A complete evaluation includes B-mode imaging, spectral Doppler, color Doppler, and power Doppler as needed of all accessible portions of each vessel. Bilateral testing is considered an integral part of a complete examination. Limited examinations for reoccurring indications may be performed as noted.  +---------+---------------+---------+-----------+----------+--------------+ RIGHT     CompressibilityPhasicitySpontaneityPropertiesThrombus Aging +---------+---------------+---------+-----------+----------+--------------+ CFV      Full           Yes      Yes                                 +---------+---------------+---------+-----------+----------+--------------+ SFJ      Full                                                        +---------+---------------+---------+-----------+----------+--------------+ FV Prox  Full                                                        +---------+---------------+---------+-----------+----------+--------------+ FV Mid   Full                                                        +---------+---------------+---------+-----------+----------+--------------+ FV DistalFull                                                        +---------+---------------+---------+-----------+----------+--------------+ PFV      Full                                                        +---------+---------------+---------+-----------+----------+--------------+ POP      Full           Yes      Yes                                 +---------+---------------+---------+-----------+----------+--------------+ PTV      Full                                                        +---------+---------------+---------+-----------+----------+--------------+ PERO     Full                                                        +---------+---------------+---------+-----------+----------+--------------+   +---------+---------------+---------+-----------+----------+--------------+  LEFT     CompressibilityPhasicitySpontaneityPropertiesThrombus Aging +---------+---------------+---------+-----------+----------+--------------+ CFV      Full           Yes      Yes                                 +---------+---------------+---------+-----------+----------+--------------+ SFJ      Full                                                         +---------+---------------+---------+-----------+----------+--------------+ FV Prox  Full                                                        +---------+---------------+---------+-----------+----------+--------------+ FV Mid   Full                                                        +---------+---------------+---------+-----------+----------+--------------+ FV DistalFull                                                        +---------+---------------+---------+-----------+----------+--------------+ PFV      Full                                                        +---------+---------------+---------+-----------+----------+--------------+ POP      Full           Yes      Yes                                 +---------+---------------+---------+-----------+----------+--------------+ PTV      Full                                                        +---------+---------------+---------+-----------+----------+--------------+ PERO     Full                                                        +---------+---------------+---------+-----------+----------+--------------+     Summary: Right: There is no evidence of deep vein thrombosis in the lower extremity. No cystic structure found in the popliteal fossa. Left: There is no evidence of deep vein thrombosis in the lower extremity. No cystic structure found in the popliteal fossa.  *  See table(s) above for measurements and observations.    Preliminary      Medical Consultants:    None.  Anti-Infectives:   Anti-infectives (From admission, onward)   Start     Dose/Rate Route Frequency Ordered Stop   08/29/19 1000  remdesivir 100 mg in sodium chloride 0.9 % 100 mL IVPB     100 mg 200 mL/hr over 30 Minutes Intravenous Daily 08/28/19 0042 09/01/19 1100   08/28/19 0300  remdesivir 200 mg in sodium chloride 0.9% 250 mL IVPB     200 mg 580 mL/hr over 30 Minutes Intravenous Once 08/28/19  0042 08/28/19 0413       Subjective:    Patient states that he is tired.  Denies any chest pain.  Continues to have some difficulty breathing.  Dry cough.  No nausea vomiting.  Urinating.  Objective:    Vitals:   09/04/19 0703 09/04/19 0725 09/04/19 1048 09/04/19 1109  BP: 94/61 97/64  105/77  Pulse: 98 92 88 87  Resp: (!) 22 (!) 22 (!) 26 (!) 25  Temp: 97.9 F (36.6 C)   97.9 F (36.6 C)  TempSrc: Axillary   Axillary  SpO2: 93% 95% 97% 95%  Weight:      Height:       SpO2: 95 % O2 Flow Rate (L/min): 15 L/min   Intake/Output Summary (Last 24 hours) at 09/04/2019 1237 Last data filed at 09/04/2019 0800 Gross per 24 hour  Intake 960 ml  Output --  Net 960 ml   Filed Weights   08/27/19 1945  Weight: 133.8 kg    General appearance: Awake alert.  In no distress Resp: Tachypneic at rest.  No use of accessory muscles.  Coarse breath sounds with crackles bilateral bases.  No wheezing or rhonchi.   Cardio: S1-S2 is normal regular.  No S3-S4.  No rubs murmurs or bruit GI: Abdomen is soft.  Nontender nondistended.  Bowel sounds are present normal.  No masses organomegaly Extremities: No edema.  Full range of motion of lower extremities. Neurologic: Alert and oriented x3.  No focal neurological deficits.    Data Reviewed:    Labs: Basic Metabolic Panel: Recent Labs  Lab 08/30/19 0625 08/31/19 0550 09/01/19 0011 09/03/19 0500 09/04/19 0400  NA 137 136 137 136 140  K 3.6 3.3* 3.1* 2.4* 3.4*  CL 101 99 100 101 101  CO2 23 26 26 23 26   GLUCOSE 247* 169* 141* 48* 58*  BUN 19 18 16 11 9   CREATININE 0.76 0.69 0.81 0.73 0.77  CALCIUM 8.7* 8.4* 8.4* 8.0* 8.4*  MG  --   --   --  2.1  --    GFR Estimated Creatinine Clearance: 157.1 mL/min (by C-G formula based on SCr of 0.77 mg/dL). Liver Function Tests: Recent Labs  Lab 08/30/19 0625 08/31/19 0550 09/01/19 0011  AST 33 29 30  ALT 30 28 28   ALKPHOS 58 58 61  BILITOT 0.5 0.8 0.8  PROT 6.3* 5.9* 6.1*  ALBUMIN  3.1* 3.2* 3.2*   COVID-19 Labs  Recent Labs    09/04/19 0400  DDIMER 5.06*  CRP 0.5    Lab Results  Component Value Date   SARSCOV2NAA Detected (A) 08/26/2019    CBC: Recent Labs  Lab 08/29/19 0800 08/30/19 0625 08/31/19 0550 09/01/19 0011 09/02/19 0435  WBC 4.6 7.1 5.8 6.2 7.9  NEUTROABS 3.7 5.9 4.8 4.9 6.5  HGB 15.0 15.4 15.4 15.4 15.5  HCT 44.6 45.0 44.3 45.7 45.2  MCV  86.4 85.6 85.9 86.2 85.8  PLT 269 342 328 348 326   CBG: Recent Labs  Lab 09/03/19 1115 09/03/19 1514 09/03/19 2008 09/04/19 0703 09/04/19 1109  GLUCAP 113* 140* 171* 76 116*   D-Dimer: Recent Labs    09/04/19 0400  DDIMER 5.06*   Sepsis Labs: Recent Labs  Lab 08/30/19 0625 08/31/19 0550 09/01/19 0011 09/02/19 0435  WBC 7.1 5.8 6.2 7.9   Microbiology Recent Results (from the past 240 hour(s))  Novel Coronavirus, NAA ([facility])     Status: Abnormal   Collection Time: 08/26/19 10:25 AM   Specimen: Nasopharyngeal(NP) swabs in vial transport medium   NASOPHARYNGE  TESTING  Result Value Ref Range Status   SARS-CoV-2, NAA Detected (A) Not Detected Final    Comment: Testing was performed using the cobas(R) SARS-CoV-2 test. This nucleic acid amplification test was developed and its performance characteristics determined by [employer]. Nucleic acid amplification tests include PCR and TMA. This test has not been FDA cleared or approved. This test has been authorized by FDA under an Emergency Use Authorization (EUA). This test is only authorized for the duration of time the declaration that circumstances exist justifying the authorization of the emergency use of in vitro diagnostic tests for detection of SARS-CoV-2 virus and/or diagnosis of COVID-19 infection under section 564(b)(1) of the Act, 21 U.S.C. 532DJM-4(Q) (1), unless the authorization is terminated or revoked sooner. When diagnostic testing is negative, the possibility of a false negative result should be  considered in the context of a patient's recent exposures and the presence of clinical signs and symptoms consistent with COVID-19. An individual without symptoms  of COVID-19 and who is not shedding SARS-CoV-2 virus would expect to have a negative (not detected) result in this assay.   Blood Culture (routine x 2)     Status: None   Collection Time: 08/27/19  9:44 PM   Specimen: Left Antecubital; Blood  Result Value Ref Range Status   Specimen Description LEFT ANTECUBITAL  Final   Special Requests Blood Culture adequate volume  Final   Culture   Final    NO GROWTH 6 DAYS Performed at [facility], [address] [place], [place] [address]    Report Status 09/02/2019 FINAL  Final  Blood Culture (routine x 2)     Status: None   Collection Time: 08/27/19  9:45 PM   Specimen: BLOOD LEFT HAND  Result Value Ref Range Status   Specimen Description BLOOD LEFT HAND  Final   Special Requests   Final    BOTTLES DRAWN AEROBIC AND ANAEROBIC Blood Culture adequate volume   Culture   Final    NO GROWTH 6 DAYS Performed at [facility], [address], [place], [place] [address]    Report Status 09/02/2019 FINAL  Final     Medications:   . vitamin C  500 mg Oral Daily  . dexamethasone  6 mg Oral Daily  . enoxaparin (LOVENOX) injection  65 mg Subcutaneous Q24H  . insulin aspart  0-20 Units Subcutaneous TID WC  . insulin aspart  0-5 Units Subcutaneous QHS  . pantoprazole  40 mg Oral BID  . potassium chloride  40 mEq Oral Once  . zinc sulfate  220 mg Oral Daily   Continuous Infusions:     LOS: 8 days   [employer]  [facility]  09/04/2019, 12:37 PM

## 2019-09-04 NOTE — Progress Notes (Signed)
ANTICOAGULATION CONSULT NOTE - Initial Consult  Pharmacy Consult for enoxaparin Indication: VTE prophylaxis in setting of COVID 19  No Known Allergies  Patient Measurements: Height: 5\' 10"  (177.8 cm) Weight: 295 lb (133.8 kg) IBW/kg (Calculated) : 73   Vital Signs: Temp: 97.9 F (36.6 C) (01/06 1109) Temp Source: Axillary (01/06 1109) BP: 105/77 (01/06 1109) Pulse Rate: 87 (01/06 1109)  Labs: Recent Labs    09/02/19 0435 09/03/19 0500 09/04/19 0400  HGB 15.5  --   --   HCT 45.2  --   --   PLT 326  --   --   CREATININE  --  0.73 0.77    Estimated Creatinine Clearance: 157.1 mL/min (by C-G formula based on SCr of 0.77 mg/dL).   Medical History: Past Medical History:  Diagnosis Date  . Pancreatitis   . Type 2 diabetes mellitus (HCC)     Assessment: Patient's DDimer is now >5, therefore patient will be changed to 0.5mg /kg SQ q12h of enoxaparin for VTE prophylaxis per protocol.   Goal of Therapy:  Monitor platelets by anticoagulation protocol: Yes   Plan:  Enoxaparin 65mg  SQ q12h Monitor for bleeding and clotting.   Kendric Sindelar A. 11/02/19, PharmD, BCPS, FNKF Clinical Pharmacist [place] Please utilize Amion for appropriate phone number to reach the unit pharmacist [facility])   09/04/2019,12:46 PM

## 2019-09-04 NOTE — Progress Notes (Signed)
Bilateral lower extremity venous duplex has been completed. Preliminary results can be found in CV Proc through chart review.   09/04/19 11:45 AM Olen Cordial RVT

## 2019-09-04 NOTE — Plan of Care (Signed)

## 2019-09-05 ENCOUNTER — Inpatient Hospital Stay (HOSPITAL_COMMUNITY): Payer: Commercial Managed Care - PPO

## 2019-09-05 LAB — GLUCOSE, CAPILLARY
Glucose-Capillary: 140 mg/dL — ABNORMAL HIGH (ref 70–99)
Glucose-Capillary: 174 mg/dL — ABNORMAL HIGH (ref 70–99)
Glucose-Capillary: 212 mg/dL — ABNORMAL HIGH (ref 70–99)
Glucose-Capillary: 167 mg/dL — ABNORMAL HIGH (ref 70–99)

## 2019-09-05 LAB — COMPREHENSIVE METABOLIC PANEL
ALT: 44 U/L (ref 0–44)
AST: 45 U/L — ABNORMAL HIGH (ref 15–41)
Albumin: 3 g/dL — ABNORMAL LOW (ref 3.5–5.0)
Alkaline Phosphatase: 62 U/L (ref 38–126)
Anion gap: 14 (ref 5–15)
BUN: 11 mg/dL (ref 6–20)
CO2: 22 mmol/L (ref 22–32)
Calcium: 8 mg/dL — ABNORMAL LOW (ref 8.9–10.3)
Chloride: 100 mmol/L (ref 98–111)
Creatinine, Ser: 0.8 mg/dL (ref 0.61–1.24)
GFR calc Af Amer: >60 mL/min (ref >60)
GFR calc non Af Amer: >60 mL/min (ref >60)
Glucose, Bld: 95 mg/dL (ref 70–99)
Potassium: 4.3 mmol/L (ref 3.5–5.1)
Sodium: 136 mmol/L (ref 135–145)
Total Bilirubin: 0.7 mg/dL (ref 0.3–1.2)
Total Protein: 5.3 g/dL — ABNORMAL LOW (ref 6.5–8.1)

## 2019-09-05 LAB — CBC
HCT: 46.6 % (ref 39.0–52.0)
Hemoglobin: 15.7 g/dL (ref 13.0–17.0)
MCH: 29.3 pg (ref 26.0–34.0)
MCHC: 33.7 g/dL (ref 30.0–36.0)
MCV: 87.1 fL (ref 80.0–100.0)
Platelets: 240 10*3/uL (ref 150–400)
RBC: 5.35 MIL/uL (ref 4.22–5.81)
RDW: 13.2 % (ref 11.5–15.5)
WBC: 6.6 10*3/uL (ref 4.0–10.5)
nRBC: 0 % (ref 0.0–0.2)

## 2019-09-05 LAB — D-DIMER, QUANTITATIVE: D-Dimer, Quant: 14.13 ug/mL-FEU — ABNORMAL HIGH (ref 0.00–0.50)

## 2019-09-05 LAB — PROCALCITONIN: Procalcitonin: <0.1 ng/mL

## 2019-09-05 MED ORDER — FUROSEMIDE 10 MG/ML IJ SOLN
60.0000 mg | Freq: Once | INTRAMUSCULAR | Status: AC
  Administered 2019-09-05: 60 mg via INTRAVENOUS
  Filled 2019-09-05: qty 6

## 2019-09-05 MED ORDER — IOHEXOL 350 MG/ML SOLN
80.0000 mL | Freq: Once | INTRAVENOUS | Status: AC | PRN
  Administered 2019-09-05: 80 mL via INTRAVENOUS

## 2019-09-05 MED ORDER — DEXAMETHASONE SODIUM PHOSPHATE 10 MG/ML IJ SOLN
6.0000 mg | INTRAMUSCULAR | Status: DC
  Administered 2019-09-06 – 2019-09-10 (×5): 6 mg via INTRAVENOUS
  Filled 2019-09-05 (×5): qty 1

## 2019-09-05 MED ORDER — POTASSIUM CHLORIDE CRYS ER 20 MEQ PO TBCR
20.0000 meq | EXTENDED_RELEASE_TABLET | Freq: Every day | ORAL | Status: DC
  Administered 2019-09-05 – 2019-09-17 (×13): 20 meq via ORAL
  Filled 2019-09-05 (×13): qty 1

## 2019-09-05 MED ORDER — FLUTICASONE PROPIONATE 50 MCG/ACT NA SUSP
2.0000 | Freq: Every day | NASAL | Status: DC
  Administered 2019-09-05 – 2019-09-16 (×11): 2 via NASAL
  Filled 2019-09-05: qty 16

## 2019-09-05 NOTE — Progress Notes (Signed)
[facility] PROGRESS NOTE    Progress Note  JAZIAH Miller  YBO:[id number] DOB: Dec 15, 1970 DOA: 08/27/2019 PCP: Patient, No Pcp Per     Brief Narrative:   Henry Miller is an 49 y.o. male past medical history of prediabetes, DVT PE presents with a 1 day history of worsening shortness of breath he relates he tested positive for SARS-CoV-2 on 08/26/2019.  Prior to this he had been having mild symptoms of nonproductive cough and a low-grade fever accompanied by nausea vomiting diarrhea.  Checked his saturations at home and was 86% on room air, EMS placed him in 6 L and improved to 89 found tachycardic in the ED and satting 89% on 6 L we will start on remdesivir and steroids oxygen supplementation was increased to 15 L and saturations improved.  Actemra has been given  Assessment/Plan:   Acute respiratory failure with hypoxia due to Pneumonia due to COVID-19 virus  Recent Labs  Lab 08/30/19 0625 08/31/19 0550 09/01/19 0011 09/04/19 0400 09/05/19 0012  DDIMER 0.58* 0.64* 0.71* 5.06* 14.13*  CRP 2.3* 1.0* 0.7 0.5  --   ALT 30 28 28   --  44  PROCALCITON  --   --   --   --  <0.10    Patient was hospitalized and placed on remdesivir and steroids.  He was also given 2 doses of Actemra.  He remains on dexamethasone.  Patient continues to require high amounts of oxygen.  He is currently on high flow nasal cannula along with a nonrebreather.  Any attempts to wean him off of the nonrebreather results and saturations in the 70s.  His inflammatory markers had improved.  Procalcitonin is less than 0.1.  D-dimer however noted to have increased significantly to 14.13.  Lower extremity Doppler studies did not show any DVT.  Patient is also noted to be tachycardic.  Even though he had a CT scan last week which did not show any PE he could have development in the last few days.  This was discussed with the patient and he was agreeable to undergo another CT angiogram.  This was done this morning and  also does not show any PE but did show progression of his pneumonia.  This is a likely explanation for his high oxygen requirements. Continue with incentive spirometry, prone positioning, mobilization as much as possible. We will give him additional dose of Lasix today.  Continue with intermediate dosing with Lovenox.  He is experiencing some epistaxis.  Nasal spray, Flonase will be utilized.  Continue to hold off on antibiotics for now due to normal procalcitonin and normal WBC. He remains at high risk for decompensation.  We will switch him over to heated high flow [place].  If he continues to worsen then we will need to move him to the intensive care unit.  Diabetes mellitus type 2, uncontrolled with hyperglycemia  HbA1c 9.9.  He developed hypoglycemia and so his Lantus was discontinued.  Continue just SSI for now.    Morbid obesity Estimated body mass index is 42.33 kg/m as calculated from the following:   Height as of this encounter: 5\' 10"  (1.778 m).   Weight as of this encounter: 133.8 kg.  Hypokalemia: Repleted.  Normal today.  Continue to monitor while he is getting diuresed.     DVT prophylaxis: lovenox CODE STATUS: Full code Family Communication:none Disposition Plan: Remains very tenuous.  Low threshold for transfer to ICU.     IV Access:    Peripheral IV  Procedures and diagnostic studies:   CT ANGIO CHEST PE W OR WO CONTRAST  Result Date: 09/05/2019 CLINICAL DATA:  49 year old male with a history shortness of breath, admission for COVID EXAM: CT ANGIOGRAPHY CHEST WITH CONTRAST TECHNIQUE: Multidetector CT imaging of the chest was performed using the standard protocol during bolus administration of intravenous contrast. Multiplanar CT image reconstructions and MIPs were obtained to evaluate the vascular anatomy. CONTRAST:  71mL OMNIPAQUE IOHEXOL 350 MG/ML SOLN COMPARISON:  08/27/2019 FINDINGS: Cardiovascular: Heart: No cardiomegaly. No pericardial fluid/thickening.  Calcifications of the left anterior descending coronary artery. Aorta: Unremarkable course, caliber, contour of the thoracic aorta. No aneurysm or dissection flap. No periaortic fluid. Pulmonary arteries: No central filling defects of the main pulmonary artery, lobar arteries, segmental arteries. Beyond the segmental arteries the study is limited. Mediastinum/Nodes: Mediastinal lymphadenopathy, with enlarged lymph nodes throughout the nodal stations. Largest nodes in the subcarinal nodal station, with the index node measuring 16 mm. Bilateral hilar adenopathy. Unremarkable appearance of the thoracic esophagus. Hiatal hernia. Lungs/Pleura: Redemonstration of diffuse ground-glass opacities of the bilateral lungs, which has progressed from the comparison CT. All lobes are involved, with a severity gradient from superior to inferior. There is developing subpleural banding in the lingula, which has progressed from the comparison CT and mild associated architectural distortion. This is in a region of partial clearing of the ground-glass findings from the prior CT. No pneumothorax or pleural effusion. Upper Abdomen: No acute. Musculoskeletal: No acute displaced fracture. Degenerative changes of the spine. Review of the MIP images confirms the above findings. IMPRESSION: Study is negative for pulmonary embolism. Typical ground-glass pulmonary changes of COVID infection, which has progressed from the comparison CT, involving all pulmonary lobes in a cranial-caudal severity gradient. Mild architectural distortion and subpleural banding of the lingula, new from the comparison in a region of partial clearing/resolution. Coronary artery disease. Reactive mediastinal adenopathy. Electronically Signed   By: Gilmer Mor D.O.   On: 09/05/2019 10:26   DG CHEST PORT 1 VIEW  Result Date: 09/04/2019 CLINICAL DATA:  Coronavirus infection.  Follow-up pneumonia. EXAM: PORTABLE CHEST 1 VIEW COMPARISON:  08/27/2019 FINDINGS: Heart is  mildly enlarged. There is worsening of widespread patchy bilateral pulmonary infiltrates, more severe in the lower lungs. No dense consolidation, lobar collapse or pleural effusion. IMPRESSION: Worsening of widespread bilateral pulmonary infiltrates, lower lobe predominant. Electronically Signed   By: Paulina Fusi M.D.   On: 09/04/2019 10:29   LE Venous  Result Date: 09/04/2019  Lower Venous Study Indications: Swelling.  Risk Factors: COVID 19 positive. Limitations: Body habitus and poor ultrasound/tissue interface. Comparison Study: No prior studies. Performing Technologist: Chanda Busing RVT  Examination Guidelines: A complete evaluation includes B-mode imaging, spectral Doppler, color Doppler, and power Doppler as needed of all accessible portions of each vessel. Bilateral testing is considered an integral part of a complete examination. Limited examinations for reoccurring indications may be performed as noted.  +---------+---------------+---------+-----------+----------+--------------+ RIGHT    CompressibilityPhasicitySpontaneityPropertiesThrombus Aging +---------+---------------+---------+-----------+----------+--------------+ CFV      Full           Yes      Yes                                 +---------+---------------+---------+-----------+----------+--------------+ SFJ      Full                                                        +---------+---------------+---------+-----------+----------+--------------+  FV Prox  Full                                                        +---------+---------------+---------+-----------+----------+--------------+ FV Mid   Full                                                        +---------+---------------+---------+-----------+----------+--------------+ FV DistalFull                                                        +---------+---------------+---------+-----------+----------+--------------+ PFV      Full                                                         +---------+---------------+---------+-----------+----------+--------------+ POP      Full           Yes      Yes                                 +---------+---------------+---------+-----------+----------+--------------+ PTV      Full                                                        +---------+---------------+---------+-----------+----------+--------------+ PERO     Full                                                        +---------+---------------+---------+-----------+----------+--------------+   +---------+---------------+---------+-----------+----------+--------------+ LEFT     CompressibilityPhasicitySpontaneityPropertiesThrombus Aging +---------+---------------+---------+-----------+----------+--------------+ CFV      Full           Yes      Yes                                 +---------+---------------+---------+-----------+----------+--------------+ SFJ      Full                                                        +---------+---------------+---------+-----------+----------+--------------+ FV Prox  Full                                                        +---------+---------------+---------+-----------+----------+--------------+  FV Mid   Full                                                        +---------+---------------+---------+-----------+----------+--------------+ FV DistalFull                                                        +---------+---------------+---------+-----------+----------+--------------+ PFV      Full                                                        +---------+---------------+---------+-----------+----------+--------------+ POP      Full           Yes      Yes                                 +---------+---------------+---------+-----------+----------+--------------+ PTV      Full                                                         +---------+---------------+---------+-----------+----------+--------------+ PERO     Full                                                        +---------+---------------+---------+-----------+----------+--------------+     Summary: Right: There is no evidence of deep vein thrombosis in the lower extremity. No cystic structure found in the popliteal fossa. Left: There is no evidence of deep vein thrombosis in the lower extremity. No cystic structure found in the popliteal fossa.  *See table(s) above for measurements and observations. Electronically signed by Gretta Beganodd Early MD on 09/04/2019 at 4:41:01 PM.    Final      Medical Consultants:    None.  Anti-Infectives:   Anti-infectives (From admission, onward)   Start     Dose/Rate Route Frequency Ordered Stop   08/29/19 1000  remdesivir 100 mg in sodium chloride 0.9 % 100 mL IVPB     100 mg 200 mL/hr over 30 Minutes Intravenous Daily 08/28/19 0042 09/01/19 1100   08/28/19 0300  remdesivir 200 mg in sodium chloride 0.9% 250 mL IVPB     200 mg 580 mL/hr over 30 Minutes Intravenous Once 08/28/19 0042 08/28/19 0413       Subjective:    Patient states that he is more short of breath today.  Denies any chest pain.  Continues to have a dry cough.  No nausea or vomiting.  Objective:    Vitals:   09/05/19 0322 09/05/19 0400 09/05/19 0546 09/05/19 1157  BP: 106/68   (!) 97/95  Pulse: 98 81  (!) 109  Resp:  19 10 13  (!) 22  Temp: 98.1 F (36.7 C)   97.8 F (36.6 C)  TempSrc: Oral   Oral  SpO2: 90% 93%  (!) 88%  Weight:      Height:       SpO2: (!) 88 % O2 Flow Rate (L/min): 15 L/min FiO2 (%): 100 %   Intake/Output Summary (Last 24 hours) at 09/05/2019 1313 Last data filed at 09/05/2019 1000 Gross per 24 hour  Intake --  Output 400 ml  Net -400 ml   Filed Weights   08/27/19 1945  Weight: 133.8 kg    General appearance: Awake alert.  In no distress Resp: Noted to be tachypneic with use of accessory muscles.  Crackles  bilaterally.  No wheezing or rhonchi.  Cardio: S1-S2 is tachycardic regular.  No S3-S4.  No rubs murmurs or bruit GI: Abdomen is soft.  Nontender nondistended.  Bowel sounds are present normal.  No masses organomegaly Extremities: No edema.  Full range of motion of lower extremities. Neurologic: Alert and oriented x3.  No focal neurological deficits.    Data Reviewed:    Labs: Basic Metabolic Panel: Recent Labs  Lab 08/31/19 0550 09/01/19 0011 09/03/19 0500 09/04/19 0400 09/05/19 0012  NA 136 137 136 140 136  K 3.3* 3.1* 2.4* 3.4* 4.3  CL 99 100 101 101 100  CO2 26 26 23 26 22   GLUCOSE 169* 141* 48* 58* 95  BUN 18 16 11 9 11   CREATININE 0.69 0.81 0.73 0.77 0.80  CALCIUM 8.4* 8.4* 8.0* 8.4* 8.0*  MG  --   --  2.1  --   --    GFR Estimated Creatinine Clearance: 157.1 mL/min (by C-G formula based on SCr of 0.8 mg/dL). Liver Function Tests: Recent Labs  Lab 08/30/19 0625 08/31/19 0550 09/01/19 0011 09/05/19 0012  AST 33 29 30 45*  ALT 30 28 28  44  ALKPHOS 58 58 61 62  BILITOT 0.5 0.8 0.8 0.7  PROT 6.3* 5.9* 6.1* 5.3*  ALBUMIN 3.1* 3.2* 3.2* 3.0*   COVID-19 Labs  Recent Labs    09/04/19 0400 09/05/19 0012  DDIMER 5.06* 14.13*  CRP 0.5  --     Lab Results  Component Value Date   SARSCOV2NAA Detected (A) 08/26/2019    CBC: Recent Labs  Lab 08/30/19 0625 08/31/19 0550 09/01/19 0011 09/02/19 0435 09/05/19 0012  WBC 7.1 5.8 6.2 7.9 6.6  NEUTROABS 5.9 4.8 4.9 6.5  --   HGB 15.4 15.4 15.4 15.5 15.7  HCT 45.0 44.3 45.7 45.2 46.6  MCV 85.6 85.9 86.2 85.8 87.1  PLT 342 328 348 326 240   CBG: Recent Labs  Lab 09/04/19 1109 09/04/19 1706 09/04/19 2034 09/05/19 0712 09/05/19 1120  GLUCAP 116* 160* 152* 140* 174*   D-Dimer: Recent Labs    09/04/19 0400 09/05/19 0012  DDIMER 5.06* 14.13*   Sepsis Labs: Recent Labs  Lab 08/31/19 0550 09/01/19 0011 09/02/19 0435 09/05/19 0012  PROCALCITON  --   --   --  <0.10  WBC 5.8 6.2 7.9 6.6    Microbiology Recent Results (from the past 240 hour(s))  Blood Culture (routine x 2)     Status: None   Collection Time: 08/27/19  9:44 PM   Specimen: Left Antecubital; Blood  Result Value Ref Range Status   Specimen Description LEFT ANTECUBITAL  Final   Special Requests Blood Culture adequate volume  Final   Culture   Final    NO GROWTH 6 DAYS Performed at  [facility], [address], [place], [place] [address]    Report Status 09/02/2019 FINAL  Final  Blood Culture (routine x 2)     Status: None   Collection Time: 08/27/19  9:45 PM   Specimen: BLOOD LEFT HAND  Result Value Ref Range Status   Specimen Description BLOOD LEFT HAND  Final   Special Requests   Final    BOTTLES DRAWN AEROBIC AND ANAEROBIC Blood Culture adequate volume   Culture   Final    NO GROWTH 6 DAYS Performed at [facility], [address] [place], [place] [address]    Report Status 09/02/2019 FINAL  Final     Medications:   . vitamin C  500 mg Oral Daily  . dexamethasone  6 mg Oral Daily  . enoxaparin (LOVENOX) injection  65 mg Subcutaneous Q12H  . fluticasone  2 spray Each Nare Daily  . insulin aspart  0-20 Units Subcutaneous TID WC  . insulin aspart  0-5 Units Subcutaneous QHS  . pantoprazole  40 mg Oral BID  . zinc sulfate  220 mg Oral Daily   Continuous Infusions:     LOS: 9 days   [employer]  [facility]  09/05/2019, 1:13 PM

## 2019-09-05 NOTE — Plan of Care (Signed)
Pt A&Ox4. Elevated HR, soft BP, O2 sats 88% on 15L HFNC, 100% NRB proned. Provider made aware of respiratory status. HHFNC ordered, charge made aware, notified RN that a bed needs to open up on 1C for pt to transfer for HHFNC.  Utilizing [facility] to void  Given 60mg  PIV lasix x1 w/ adequate output.   Bgs stable, covered w. Insulin  Transferred to #[id number]. Report given to , RN all questions answered  Vara Guardian    Problem: Education: Goal: Ability to describe self-care measures that may prevent or decrease complications (Diabetes Survival Skills Education) will improve Outcome: Progressing Goal: Individualized Educational Video(s) Outcome: Progressing   Problem: Coping: Goal: Ability to adjust to condition or change in health will improve Outcome: Progressing   Problem: Fluid Volume: Goal: Ability to maintain a balanced intake and output will improve Outcome: Progressing   Problem: Health Behavior/Discharge Planning: Goal: Ability to identify and utilize available resources and services will improve Outcome: Progressing Goal: Ability to manage health-related needs will improve Outcome: Progressing   Problem: Metabolic: Goal: Ability to maintain appropriate glucose levels will improve Outcome: Progressing   Problem: Nutritional: Goal: Maintenance of adequate nutrition will improve Outcome: Progressing Goal: Progress toward achieving an optimal weight will improve Outcome: Progressing   Problem: Skin Integrity: Goal: Risk for impaired skin integrity will decrease Outcome: Progressing   Problem: Tissue Perfusion: Goal: Adequacy of tissue perfusion will improve Outcome: Progressing   Problem: Education: Goal: Knowledge of General Education information will improve Description: Including pain rating scale, medication(s)/side effects and non-pharmacologic comfort measures Outcome: Progressing   Problem: Health Behavior/Discharge Planning: Goal:  Ability to manage health-related needs will improve Outcome: Progressing   Problem: Clinical Measurements: Goal: Ability to maintain clinical measurements within normal limits will improve Outcome: Progressing Goal: Will remain free from infection Outcome: Progressing Goal: Diagnostic test results will improve Outcome: Progressing Goal: Respiratory complications will improve Outcome: Progressing Goal: Cardiovascular complication will be avoided Outcome: Progressing   Problem: Activity: Goal: Risk for activity intolerance will decrease Outcome: Progressing   Problem: Nutrition: Goal: Adequate nutrition will be maintained Outcome: Progressing   Problem: Coping: Goal: Level of anxiety will decrease Outcome: Progressing   Problem: Elimination: Goal: Will not experience complications related to bowel motility Outcome: Progressing Goal: Will not experience complications related to urinary retention Outcome: Progressing   Problem: Pain Managment: Goal: General experience of comfort will improve Outcome: Progressing   Problem: Safety: Goal: Ability to remain free from injury will improve Outcome: Progressing   Problem: Skin Integrity: Goal: Risk for impaired skin integrity will decrease Outcome: Progressing

## 2019-09-05 NOTE — Progress Notes (Signed)
PT Cancellation Note  Patient Details Name: Henry Miller MRN: [id number] DOB: [birth year]   Cancelled Treatment:    Reason Eval/Treat Not Completed: Medical issues which prohibited therapy.  Pt on max HFNC and NRB, proned and still sating 85%.  Pt not appropriate for mobilization today and may be moving to 1C for heated high flow per RN.  PT to check on him tomorrow.   Thanks,  Corinna Capra, PT, DPT  Acute Rehabilitation [phone] pager [phone] office  @ [facility]: [phone]     Dimas Aguas 09/05/2019, 2:44 PM

## 2019-09-05 NOTE — Progress Notes (Addendum)
18:30pm Transferred to [facility] via bed, placed on Heated Hi flow 40 L 100%. Sat 91% sitting up in bed. Alert and oriented x4, all vitals stable.

## 2019-09-06 LAB — COMPREHENSIVE METABOLIC PANEL
ALT: 43 U/L (ref 0–44)
AST: 36 U/L (ref 15–41)
Albumin: 3.4 g/dL — ABNORMAL LOW (ref 3.5–5.0)
Alkaline Phosphatase: 76 U/L (ref 38–126)
Anion gap: 15 (ref 5–15)
BUN: 14 mg/dL (ref 6–20)
CO2: 25 mmol/L (ref 22–32)
Calcium: 8.3 mg/dL — ABNORMAL LOW (ref 8.9–10.3)
Chloride: 95 mmol/L — ABNORMAL LOW (ref 98–111)
Creatinine, Ser: 0.71 mg/dL (ref 0.61–1.24)
GFR calc Af Amer: >60 mL/min (ref >60)
GFR calc non Af Amer: >60 mL/min (ref >60)
Glucose, Bld: 127 mg/dL — ABNORMAL HIGH (ref 70–99)
Potassium: 3.7 mmol/L (ref 3.5–5.1)
Sodium: 135 mmol/L (ref 135–145)
Total Bilirubin: 1.3 mg/dL — ABNORMAL HIGH (ref 0.3–1.2)
Total Protein: 6.1 g/dL — ABNORMAL LOW (ref 6.5–8.1)

## 2019-09-06 LAB — CBC
HCT: 49.7 % (ref 39.0–52.0)
Hemoglobin: 16.8 g/dL (ref 13.0–17.0)
MCH: 29.7 pg (ref 26.0–34.0)
MCHC: 33.8 g/dL (ref 30.0–36.0)
MCV: 88 fL (ref 80.0–100.0)
Platelets: 283 10*3/uL (ref 150–400)
RBC: 5.65 MIL/uL (ref 4.22–5.81)
RDW: 13.4 % (ref 11.5–15.5)
WBC: 10.8 10*3/uL — ABNORMAL HIGH (ref 4.0–10.5)
nRBC: 0 % (ref 0.0–0.2)

## 2019-09-06 LAB — GLUCOSE, CAPILLARY
Glucose-Capillary: 163 mg/dL — ABNORMAL HIGH (ref 70–99)
Glucose-Capillary: 179 mg/dL — ABNORMAL HIGH (ref 70–99)
Glucose-Capillary: 237 mg/dL — ABNORMAL HIGH (ref 70–99)

## 2019-09-06 LAB — D-DIMER, QUANTITATIVE: D-Dimer, Quant: 3.17 ug/mL-FEU — ABNORMAL HIGH (ref 0.00–0.50)

## 2019-09-06 LAB — PROCALCITONIN: Procalcitonin: <0.1 ng/mL

## 2019-09-06 MED ORDER — FUROSEMIDE 10 MG/ML IJ SOLN
40.0000 mg | Freq: Once | INTRAMUSCULAR | Status: AC
  Administered 2019-09-06: 12:00:00 40 mg via INTRAVENOUS
  Filled 2019-09-06: qty 4

## 2019-09-06 MED ORDER — MOMETASONE FURO-FORMOTEROL FUM 200-5 MCG/ACT IN AERO
2.0000 | INHALATION_SPRAY | Freq: Two times a day (BID) | RESPIRATORY_TRACT | Status: DC
  Administered 2019-09-06 – 2019-09-17 (×23): 2 via RESPIRATORY_TRACT
  Filled 2019-09-06: qty 8.8

## 2019-09-06 MED ORDER — IPRATROPIUM-ALBUTEROL 20-100 MCG/ACT IN AERS
4.0000 | INHALATION_SPRAY | Freq: Four times a day (QID) | RESPIRATORY_TRACT | Status: DC
  Administered 2019-09-06 – 2019-09-08 (×10): 4 via RESPIRATORY_TRACT
  Filled 2019-09-06: qty 4

## 2019-09-06 NOTE — Progress Notes (Signed)
[facility] PROGRESS NOTE    Progress Note  COLT MARTELLE  HEN:[id number] DOB: 1970-12-27 DOA: 08/27/2019 PCP: Patient, No Pcp Per     Brief Narrative:   KANTON KAMEL is an 49 y.o. male past medical history of prediabetes, DVT PE presents with a 1 day history of worsening shortness of breath he relates he tested positive for SARS-CoV-2 on 08/26/2019.  Prior to this he had been having mild symptoms of nonproductive cough and a low-grade fever accompanied by nausea vomiting diarrhea.  Checked his saturations at home and was 86% on room air, EMS placed him in 6 L and improved to 89 found tachycardic in the ED and satting 89% on 6 L we will start on remdesivir and steroids oxygen supplementation was increased to 15 L and saturations improved.  Actemra has been given  Assessment/Plan:   Acute respiratory failure with hypoxia due to Pneumonia due to COVID-19 virus  Recent Labs  Lab 08/31/19 0550 09/01/19 0011 09/04/19 0400 09/05/19 0012 09/06/19 0109  DDIMER 0.64* 0.71* 5.06* 14.13* 3.17*  CRP 1.0* 0.7 0.5  --   --   ALT 28 28  --  44 43  PROCALCITON  --   --   --  <0.10 <0.10    Patient was hospitalized and placed on remdesivir and steroids.  He was also given 2 doses of Actemra.  He remains on dexamethasone.  Patient continues to require high amounts of oxygen.  He is now on heated high flow at 40 L/min, 100% FiO2.  Saturating in the late 80s to early 90s.  His procalcitonin has been less than 0.1.  D-dimer improved to 3.17.  CRP was 0.5 a few days ago. Lower extremity Doppler studies did not show any DVT.  CT angiogram was repeated and did not show any PE.  Progressive changes in his bilateral pneumonia was seen. Patient remains tenuous.  He understands this.  He will give an additional dose of Lasix.  Epistaxis seems to be controlled at this time.  Continue nasal spray and Flonase. We will also give him inhaler treatment.  Might help. Due to normal WBC and normal  procalcitonin we will hold off on antibacterials.  Continue with incentive spirometry, mobilization, prone positioning as much as possible. He remains at high risk for decompensation.  Diabetes mellitus type 2, uncontrolled with hyperglycemia  HbA1c 9.9.  He developed hypoglycemia and so his Lantus was discontinued.  Continue just SSI for now.  CBGs reasonably well controlled at this time.  Morbid obesity Estimated body mass index is 42.33 kg/m as calculated from the following:   Height as of this encounter: 5\' 10"  (1.778 m).   Weight as of this encounter: 133.8 kg.  Hypokalemia: Repleted.  Monitor while he is getting Lasix.     DVT prophylaxis: lovenox CODE STATUS: Full code Family Communication:none Disposition Plan: Remains very tenuous.  Low threshold for transfer to ICU.     IV Access:    Peripheral IV   Procedures and diagnostic studies:   CT ANGIO CHEST PE W OR WO CONTRAST  Result Date: 09/05/2019 CLINICAL DATA:  49 year old male with a history shortness of breath, admission for COVID EXAM: CT ANGIOGRAPHY CHEST WITH CONTRAST TECHNIQUE: Multidetector CT imaging of the chest was performed using the standard protocol during bolus administration of intravenous contrast. Multiplanar CT image reconstructions and MIPs were obtained to evaluate the vascular anatomy. CONTRAST:  108mL OMNIPAQUE IOHEXOL 350 MG/ML SOLN COMPARISON:  08/27/2019 FINDINGS: Cardiovascular: Heart: No cardiomegaly.  No pericardial fluid/thickening. Calcifications of the left anterior descending coronary artery. Aorta: Unremarkable course, caliber, contour of the thoracic aorta. No aneurysm or dissection flap. No periaortic fluid. Pulmonary arteries: No central filling defects of the main pulmonary artery, lobar arteries, segmental arteries. Beyond the segmental arteries the study is limited. Mediastinum/Nodes: Mediastinal lymphadenopathy, with enlarged lymph nodes throughout the nodal stations. Largest nodes in the  subcarinal nodal station, with the index node measuring 16 mm. Bilateral hilar adenopathy. Unremarkable appearance of the thoracic esophagus. Hiatal hernia. Lungs/Pleura: Redemonstration of diffuse ground-glass opacities of the bilateral lungs, which has progressed from the comparison CT. All lobes are involved, with a severity gradient from superior to inferior. There is developing subpleural banding in the lingula, which has progressed from the comparison CT and mild associated architectural distortion. This is in a region of partial clearing of the ground-glass findings from the prior CT. No pneumothorax or pleural effusion. Upper Abdomen: No acute. Musculoskeletal: No acute displaced fracture. Degenerative changes of the spine. Review of the MIP images confirms the above findings. IMPRESSION: Study is negative for pulmonary embolism. Typical ground-glass pulmonary changes of COVID infection, which has progressed from the comparison CT, involving all pulmonary lobes in a cranial-caudal severity gradient. Mild architectural distortion and subpleural banding of the lingula, new from the comparison in a region of partial clearing/resolution. Coronary artery disease. Reactive mediastinal adenopathy. Electronically Signed   By: Gilmer MorJaime  Wagner D.O.   On: 09/05/2019 10:26   LE Venous  Result Date: 09/04/2019  Lower Venous Study Indications: Swelling.  Risk Factors: COVID 19 positive. Limitations: Body habitus and poor ultrasound/tissue interface. Comparison Study: No prior studies. Performing Technologist: Chanda BusingGregory Collins RVT  Examination Guidelines: A complete evaluation includes B-mode imaging, spectral Doppler, color Doppler, and power Doppler as needed of all accessible portions of each vessel. Bilateral testing is considered an integral part of a complete examination. Limited examinations for reoccurring indications may be performed as noted.   +---------+---------------+---------+-----------+----------+--------------+ RIGHT    CompressibilityPhasicitySpontaneityPropertiesThrombus Aging +---------+---------------+---------+-----------+----------+--------------+ CFV      Full           Yes      Yes                                 +---------+---------------+---------+-----------+----------+--------------+ SFJ      Full                                                        +---------+---------------+---------+-----------+----------+--------------+ FV Prox  Full                                                        +---------+---------------+---------+-----------+----------+--------------+ FV Mid   Full                                                        +---------+---------------+---------+-----------+----------+--------------+ FV DistalFull                                                        +---------+---------------+---------+-----------+----------+--------------+  PFV      Full                                                        +---------+---------------+---------+-----------+----------+--------------+ POP      Full           Yes      Yes                                 +---------+---------------+---------+-----------+----------+--------------+ PTV      Full                                                        +---------+---------------+---------+-----------+----------+--------------+ PERO     Full                                                        +---------+---------------+---------+-----------+----------+--------------+   +---------+---------------+---------+-----------+----------+--------------+ LEFT     CompressibilityPhasicitySpontaneityPropertiesThrombus Aging +---------+---------------+---------+-----------+----------+--------------+ CFV      Full           Yes      Yes                                  +---------+---------------+---------+-----------+----------+--------------+ SFJ      Full                                                        +---------+---------------+---------+-----------+----------+--------------+ FV Prox  Full                                                        +---------+---------------+---------+-----------+----------+--------------+ FV Mid   Full                                                        +---------+---------------+---------+-----------+----------+--------------+ FV DistalFull                                                        +---------+---------------+---------+-----------+----------+--------------+ PFV      Full                                                        +---------+---------------+---------+-----------+----------+--------------+   POP      Full           Yes      Yes                                 +---------+---------------+---------+-----------+----------+--------------+ PTV      Full                                                        +---------+---------------+---------+-----------+----------+--------------+ PERO     Full                                                        +---------+---------------+---------+-----------+----------+--------------+     Summary: Right: There is no evidence of deep vein thrombosis in the lower extremity. No cystic structure found in the popliteal fossa. Left: There is no evidence of deep vein thrombosis in the lower extremity. No cystic structure found in the popliteal fossa.  *See table(s) above for measurements and observations. Electronically signed by Gretta Began MD on 09/04/2019 at 4:41:01 PM.    Final      Medical Consultants:    None.  Anti-Infectives:   Anti-infectives (From admission, onward)   Start     Dose/Rate Route Frequency Ordered Stop   08/29/19 1000  remdesivir 100 mg in sodium chloride 0.9 % 100 mL IVPB     100 mg 200 mL/hr over 30  Minutes Intravenous Daily 08/28/19 0042 09/01/19 1100   08/28/19 0300  remdesivir 200 mg in sodium chloride 0.9% 250 mL IVPB     200 mg 580 mL/hr over 30 Minutes Intravenous Once 08/28/19 0042 08/28/19 0413       Subjective:    Patient states that he is feeling well.  He understands that he is very sick.  Denies any nausea vomiting.  He is trying to be compliant with prone positioning and incentive spirometer.  Denies any new complaints today.  Objective:    Vitals:   09/05/19 2100 09/06/19 0000 09/06/19 0400 09/06/19 0800  BP: 114/72 102/60 114/78 103/65  Pulse: (!) 114 99 92 99  Resp: (!) 23 (!) 33 (!) 29   Temp:  99.6 F (37.6 C) 98.9 F (37.2 C) 98.9 F (37.2 C)  TempSrc:  Oral Oral Axillary  SpO2: (!) 83% 90% (!) 89% 91%  Weight:      Height:       SpO2: 91 % O2 Flow Rate (L/min): 40 L/min FiO2 (%): 100 %  No intake or output data in the 24 hours ending 09/06/19 1140 Filed Weights   08/27/19 1945  Weight: 133.8 kg    General appearance: Awake alert.  In no distress Resp: Tachypneic.  Some use of accessory muscles.  Coarse breath sounds with crackles bilaterally.  No wheezing or rhonchi.   Cardio: S1-S2 is normal regular.  No S3-S4.  No rubs murmurs or bruit GI: Abdomen is soft.  Nontender nondistended.  Bowel sounds are present normal.  No masses organomegaly Extremities: No edema.  Full range of motion of lower extremities. Neurologic: Alert and oriented x3.  No focal neurological deficits.  Data Reviewed:    Labs: Basic Metabolic Panel: Recent Labs  Lab 09/01/19 0011 09/03/19 0500 09/04/19 0400 09/05/19 0012 09/06/19 0109  NA 137 136 140 136 135  K 3.1* 2.4* 3.4* 4.3 3.7  CL 100 101 101 100 95*  CO2 26 23 26 22 25   GLUCOSE 141* 48* 58* 95 127*  BUN 16 11 9 11 14   CREATININE 0.81 0.73 0.77 0.80 0.71  CALCIUM 8.4* 8.0* 8.4* 8.0* 8.3*  MG  --  2.1  --   --   --    GFR Estimated Creatinine Clearance: 157.1 mL/min (by C-G formula based on SCr  of 0.71 mg/dL). Liver Function Tests: Recent Labs  Lab 08/31/19 0550 09/01/19 0011 09/05/19 0012 09/06/19 0109  AST 29 30 45* 36  ALT 28 28 44 43  ALKPHOS 58 61 62 76  BILITOT 0.8 0.8 0.7 1.3*  PROT 5.9* 6.1* 5.3* 6.1*  ALBUMIN 3.2* 3.2* 3.0* 3.4*   COVID-19 Labs  Recent Labs    09/04/19 0400 09/05/19 0012 09/06/19 0109  DDIMER 5.06* 14.13* 3.17*  CRP 0.5  --   --     Lab Results  Component Value Date   SARSCOV2NAA Detected (A) 08/26/2019    CBC: Recent Labs  Lab 08/31/19 0550 09/01/19 0011 09/02/19 0435 09/05/19 0012 09/06/19 0109  WBC 5.8 6.2 7.9 6.6 10.8*  NEUTROABS 4.8 4.9 6.5  --   --   HGB 15.4 15.4 15.5 15.7 16.8  HCT 44.3 45.7 45.2 46.6 49.7  MCV 85.9 86.2 85.8 87.1 88.0  PLT 328 348 326 240 283   CBG: Recent Labs  Lab 09/05/19 0712 09/05/19 1120 09/05/19 1749 09/05/19 2118 09/06/19 0811  GLUCAP 140* 174* 212* 167* 163*   D-Dimer: Recent Labs    09/05/19 0012 09/06/19 0109  DDIMER 14.13* 3.17*   Sepsis Labs: Recent Labs  Lab 09/01/19 0011 09/02/19 0435 09/05/19 0012 09/06/19 0109  PROCALCITON  --   --  <0.10 <0.10  WBC 6.2 7.9 6.6 10.8*   Microbiology Recent Results (from the past 240 hour(s))  Blood Culture (routine x 2)     Status: None   Collection Time: 08/27/19  9:44 PM   Specimen: Left Antecubital; Blood  Result Value Ref Range Status   Specimen Description LEFT ANTECUBITAL  Final   Special Requests Blood Culture adequate volume  Final   Culture   Final    NO GROWTH 6 DAYS Performed at [facility], [address], [place], [address] [place]    Report Status 09/02/2019 FINAL  Final  Blood Culture (routine x 2)     Status: None   Collection Time: 08/27/19  9:45 PM   Specimen: BLOOD LEFT HAND  Result Value Ref Range Status   Specimen Description BLOOD LEFT HAND  Final   Special Requests   Final    BOTTLES DRAWN AEROBIC AND ANAEROBIC Blood Culture adequate volume   Culture   Final    NO GROWTH 6 DAYS Performed at  [facility], [address], [place], [address] [place]    Report Status 09/02/2019 FINAL  Final     Medications:   . vitamin C  500 mg Oral Daily  . dexamethasone (DECADRON) injection  6 mg Intravenous Q24H  . enoxaparin (LOVENOX) injection  65 mg Subcutaneous Q12H  . fluticasone  2 spray Each Nare Daily  . insulin aspart  0-20 Units Subcutaneous TID WC  . insulin aspart  0-5 Units Subcutaneous QHS  . pantoprazole  40 mg  Oral BID  . potassium chloride  20 mEq Oral Daily  . zinc sulfate  220 mg Oral Daily   Continuous Infusions:     LOS: 10 days   [employer]  [facility]  09/06/2019, 11:40 AM

## 2019-09-07 LAB — D-DIMER, QUANTITATIVE: D-Dimer, Quant: 3.33 ug/mL-FEU — ABNORMAL HIGH (ref 0.00–0.50)

## 2019-09-07 LAB — CBC
HCT: 47.9 % (ref 39.0–52.0)
Hemoglobin: 16 g/dL (ref 13.0–17.0)
MCH: 29.4 pg (ref 26.0–34.0)
MCHC: 33.4 g/dL (ref 30.0–36.0)
MCV: 88.1 fL (ref 80.0–100.0)
Platelets: 248 10*3/uL (ref 150–400)
RBC: 5.44 MIL/uL (ref 4.22–5.81)
RDW: 13.5 % (ref 11.5–15.5)
WBC: 8.8 10*3/uL (ref 4.0–10.5)
nRBC: 0 % (ref 0.0–0.2)

## 2019-09-07 LAB — COMPREHENSIVE METABOLIC PANEL
ALT: 42 U/L (ref 0–44)
AST: 32 U/L (ref 15–41)
Albumin: 3.4 g/dL — ABNORMAL LOW (ref 3.5–5.0)
Alkaline Phosphatase: 76 U/L (ref 38–126)
Anion gap: 13 (ref 5–15)
BUN: 14 mg/dL (ref 6–20)
CO2: 26 mmol/L (ref 22–32)
Calcium: 8.4 mg/dL — ABNORMAL LOW (ref 8.9–10.3)
Chloride: 97 mmol/L — ABNORMAL LOW (ref 98–111)
Creatinine, Ser: 0.8 mg/dL (ref 0.61–1.24)
GFR calc Af Amer: >60 mL/min (ref >60)
GFR calc non Af Amer: >60 mL/min (ref >60)
Glucose, Bld: 174 mg/dL — ABNORMAL HIGH (ref 70–99)
Potassium: 3.7 mmol/L (ref 3.5–5.1)
Sodium: 136 mmol/L (ref 135–145)
Total Bilirubin: 1.1 mg/dL (ref 0.3–1.2)
Total Protein: 6 g/dL — ABNORMAL LOW (ref 6.5–8.1)

## 2019-09-07 LAB — GLUCOSE, CAPILLARY
Glucose-Capillary: 182 mg/dL — ABNORMAL HIGH (ref 70–99)
Glucose-Capillary: 220 mg/dL — ABNORMAL HIGH (ref 70–99)
Glucose-Capillary: 268 mg/dL — ABNORMAL HIGH (ref 70–99)
Glucose-Capillary: 260 mg/dL — ABNORMAL HIGH (ref 70–99)

## 2019-09-07 MED ORDER — FUROSEMIDE 10 MG/ML IJ SOLN
60.0000 mg | Freq: Once | INTRAMUSCULAR | Status: AC
  Administered 2019-09-07: 14:00:00 60 mg via INTRAVENOUS
  Filled 2019-09-07: qty 6

## 2019-09-07 NOTE — Progress Notes (Signed)
Pt is on non-rebreather at rest and uses HHFNC & NRB with exertion.

## 2019-09-07 NOTE — Progress Notes (Signed)
Patient wearing oxygen at 100% NRB with 15 lpm slater high flow with Sp02=92% patient lying in prone position. Patient has no c/o being short of breath

## 2019-09-07 NOTE — Progress Notes (Signed)
[facility] PROGRESS NOTE    Progress Note  Henry Miller  HAL:[id number] DOB: [birth year] DOA: 08/27/2019 PCP: Patient, No Pcp Per     Brief Narrative:   Henry Miller is an 49 y.o. male past medical history of prediabetes, DVT PE presents with a 1 day history of worsening shortness of breath he relates he tested positive for SARS-CoV-2 on 08/26/2019.  Prior to this he had been having mild symptoms of nonproductive cough and a low-grade fever accompanied by nausea vomiting diarrhea.  Checked his saturations at home and was 86% on room air, EMS placed him in 6 L and improved to 89 found tachycardic in the ED and satting 89% on 6 L we will start on remdesivir and steroids oxygen supplementation was increased to 15 L and saturations improved.  Actemra has been given  Assessment/Plan:   Acute respiratory failure with hypoxia due to Pneumonia due to COVID-19 virus  Recent Labs  Lab 09/01/19 0011 09/04/19 0400 09/05/19 0012 09/06/19 0109 09/07/19 0436  DDIMER 0.71* 5.06* 14.13* 3.17* 3.33*  CRP 0.7 0.5  --   --   --   ALT 28  --  44 43 42  PROCALCITON  --   --  <0.10 <0.10  --     Patient was hospitalized and placed on remdesivir and steroids.  He was also given 2 doses of Actemra.  He remains on dexamethasone.  Patient continues to require high amounts of oxygen.  He is currently on heated high flow at 30 L/min.  Patient states that he is feeling slightly better.  Continue to wait on clinical improvement.  D-dimer has improved to 3.3.  His inflammatory markers had normalized.  Procalcitonin was less than 0.1  Lower extremity Doppler studies did not show any DVT.  CT angiogram was repeated and did not show any PE.  Progressive changes in his bilateral pneumonia was seen.  Patient remains tenuous.  He was given Lasix the last few days and will be repeated again today.  Epistaxis has resolved.  Continue nasal spray and Flonase.  Continue inhalers.  Hold off on antibacterials.   Patient very compliant with's incentive spirometry prone positioning and mobilization as much as possible.  He remains at high risk for decompensation.  Diabetes mellitus type 2, uncontrolled with hyperglycemia  HbA1c 9.9.  Continue just SSI for now.  Lantus was discontinued due to hypoglycemia.    Morbid obesity Estimated body mass index is 42.33 kg/m as calculated from the following:   Height as of this encounter: 5\' 10"  (1.778 m).   Weight as of this encounter: 133.8 kg.  Hypokalemia: Repleted.  Monitor while he is getting Lasix.     DVT prophylaxis: lovenox CODE STATUS: Full code Family Communication:none Disposition Plan: Remains very tenuous.  Low threshold for transfer to ICU.     IV Access:    Peripheral IV   Procedures and diagnostic studies:   No results found.   Medical Consultants:    None.  Anti-Infectives:   Anti-infectives (From admission, onward)   Start     Dose/Rate Route Frequency Ordered Stop   08/29/19 1000  remdesivir 100 mg in sodium chloride 0.9 % 100 mL IVPB     100 mg 200 mL/hr over 30 Minutes Intravenous Daily 08/28/19 0042 09/01/19 1100   08/28/19 0300  remdesivir 200 mg in sodium chloride 0.9% 250 mL IVPB     200 mg 580 mL/hr over 30 Minutes Intravenous Once 08/28/19 0042 08/28/19 0413  Subjective:   Patient states that he is feeling better.  He understands that this will be a very slow process.  Denies any chest pain.  He is compliant with his spirometry.  Trying prone positioning as much as possible.  Objective:    Vitals:   09/07/19 0400 09/07/19 0500 09/07/19 0800 09/07/19 0857  BP: 108/74   113/66  Pulse: 90 (!) 104 97 (!) 108  Resp: (!) 23 (!) 23 (!) 32 (!) 22  Temp: 98.9 F (37.2 C)  98.1 F (36.7 C)   TempSrc: Oral  Axillary   SpO2: 99% 94% 97% 91%  Weight:      Height:       SpO2: 91 % O2 Flow Rate (L/min): 15 L/min FiO2 (%): 100 %   Intake/Output Summary (Last 24 hours) at 09/07/2019 1322 Last data  filed at 09/07/2019 0400 Gross per 24 hour  Intake 500 ml  Output 1550 ml  Net -1050 ml   Filed Weights   08/27/19 1945  Weight: 133.8 kg    General appearance: Awake alert.  In no distress Resp: Tachypneic.  No use of accessory muscles.  Crackles bilateral bases.  No wheezing or rhonchi. Cardio: S1-S2 is slightly tachycardic but regular.  No S3-S4.  No rubs murmurs or bruit GI: Abdomen is soft.  Nontender nondistended.  Bowel sounds are present normal.  No masses organomegaly Extremities: No edema.  Full range of motion of lower extremities. Neurologic: Alert and oriented x3.  No focal neurological deficits.     Data Reviewed:    Labs: Basic Metabolic Panel: Recent Labs  Lab 09/03/19 0500 09/04/19 0400 09/05/19 0012 09/06/19 0109 09/07/19 0436  NA 136 140 136 135 136  K 2.4* 3.4* 4.3 3.7 3.7  CL 101 101 100 95* 97*  CO2 23 26 22 25 26   GLUCOSE 48* 58* 95 127* 174*  BUN 11 9 11 14 14   CREATININE 0.73 0.77 0.80 0.71 0.80  CALCIUM 8.0* 8.4* 8.0* 8.3* 8.4*  MG 2.1  --   --   --   --    GFR Estimated Creatinine Clearance: 157.1 mL/min (by C-G formula based on SCr of 0.8 mg/dL). Liver Function Tests: Recent Labs  Lab 09/01/19 0011 09/05/19 0012 09/06/19 0109 09/07/19 0436  AST 30 45* 36 32  ALT 28 44 43 42  ALKPHOS 61 62 76 76  BILITOT 0.8 0.7 1.3* 1.1  PROT 6.1* 5.3* 6.1* 6.0*  ALBUMIN 3.2* 3.0* 3.4* 3.4*   COVID-19 Labs  Recent Labs    09/05/19 0012 09/06/19 0109 09/07/19 0436  DDIMER 14.13* 3.17* 3.33*    Lab Results  Component Value Date   SARSCOV2NAA Detected (A) 08/26/2019    CBC: Recent Labs  Lab 09/01/19 0011 09/02/19 0435 09/05/19 0012 09/06/19 0109 09/07/19 0436  WBC 6.2 7.9 6.6 10.8* 8.8  NEUTROABS 4.9 6.5  --   --   --   HGB 15.4 15.5 15.7 16.8 16.0  HCT 45.7 45.2 46.6 49.7 47.9  MCV 86.2 85.8 87.1 88.0 88.1  PLT 348 326 240 283 248   CBG: Recent Labs  Lab 09/06/19 0811 09/06/19 1148 09/06/19 1703 09/07/19 0822  09/07/19 1204  GLUCAP 163* 179* 237* 182* 220*   D-Dimer: Recent Labs    09/06/19 0109 09/07/19 0436  DDIMER 3.17* 3.33*   Sepsis Labs: Recent Labs  Lab 09/02/19 0435 09/05/19 0012 09/06/19 0109 09/07/19 0436  PROCALCITON  --  <0.10 <0.10  --   WBC 7.9 6.6 10.8* 8.8  Microbiology No results found for this or any previous visit (from the past 240 hour(s)).   Medications:   . vitamin C  500 mg Oral Daily  . dexamethasone (DECADRON) injection  6 mg Intravenous Q24H  . enoxaparin (LOVENOX) injection  65 mg Subcutaneous Q12H  . fluticasone  2 spray Each Nare Daily  . insulin aspart  0-20 Units Subcutaneous TID WC  . insulin aspart  0-5 Units Subcutaneous QHS  . Ipratropium-Albuterol  4 puff Inhalation QID  . mometasone-formoterol  2 puff Inhalation BID  . pantoprazole  40 mg Oral BID  . potassium chloride  20 mEq Oral Daily  . zinc sulfate  220 mg Oral Daily   Continuous Infusions:     LOS: 11 days   [employer]  [facility]  09/07/2019, 1:22 PM

## 2019-09-07 NOTE — Plan of Care (Signed)
POC discussed with pt. A&Ox4. No c/o pain. In NSR/ST, afebrile, BP stable. Remains on 100% Fi02, 30L, NRB. Dyspnea with exertion, otherwise, tolerating well. No BM this shift so far, voiding in BSC/urinal as needed. Able to self prone. No other issues noted at this time.

## 2019-09-08 ENCOUNTER — Inpatient Hospital Stay (HOSPITAL_COMMUNITY): Payer: Commercial Managed Care - PPO

## 2019-09-08 LAB — COMPREHENSIVE METABOLIC PANEL
ALT: 44 U/L (ref 0–44)
AST: 26 U/L (ref 15–41)
Albumin: 3.5 g/dL (ref 3.5–5.0)
Alkaline Phosphatase: 71 U/L (ref 38–126)
Anion gap: 13 (ref 5–15)
BUN: 15 mg/dL (ref 6–20)
CO2: 26 mmol/L (ref 22–32)
Calcium: 8.6 mg/dL — ABNORMAL LOW (ref 8.9–10.3)
Chloride: 97 mmol/L — ABNORMAL LOW (ref 98–111)
Creatinine, Ser: 0.79 mg/dL (ref 0.61–1.24)
GFR calc Af Amer: >60 mL/min (ref >60)
GFR calc non Af Amer: >60 mL/min (ref >60)
Glucose, Bld: 153 mg/dL — ABNORMAL HIGH (ref 70–99)
Potassium: 3.5 mmol/L (ref 3.5–5.1)
Sodium: 136 mmol/L (ref 135–145)
Total Bilirubin: 0.9 mg/dL (ref 0.3–1.2)
Total Protein: 6 g/dL — ABNORMAL LOW (ref 6.5–8.1)

## 2019-09-08 LAB — CBC
HCT: 47.6 % (ref 39.0–52.0)
Hemoglobin: 15.9 g/dL (ref 13.0–17.0)
MCH: 29.4 pg (ref 26.0–34.0)
MCHC: 33.4 g/dL (ref 30.0–36.0)
MCV: 88.1 fL (ref 80.0–100.0)
Platelets: 225 10*3/uL (ref 150–400)
RBC: 5.4 MIL/uL (ref 4.22–5.81)
RDW: 13.6 % (ref 11.5–15.5)
WBC: 9.8 10*3/uL (ref 4.0–10.5)
nRBC: 0 % (ref 0.0–0.2)

## 2019-09-08 LAB — GLUCOSE, CAPILLARY
Glucose-Capillary: 184 mg/dL — ABNORMAL HIGH (ref 70–99)
Glucose-Capillary: 279 mg/dL — ABNORMAL HIGH (ref 70–99)
Glucose-Capillary: 273 mg/dL — ABNORMAL HIGH (ref 70–99)
Glucose-Capillary: 228 mg/dL — ABNORMAL HIGH (ref 70–99)

## 2019-09-08 LAB — D-DIMER, QUANTITATIVE: D-Dimer, Quant: 2.71 ug/mL-FEU — ABNORMAL HIGH (ref 0.00–0.50)

## 2019-09-08 MED ORDER — INSULIN GLARGINE 100 UNIT/ML ~~LOC~~ SOLN
8.0000 [IU] | Freq: Every day | SUBCUTANEOUS | Status: DC
  Administered 2019-09-08 – 2019-09-11 (×4): 8 [IU] via SUBCUTANEOUS
  Filled 2019-09-08 (×4): qty 0.08

## 2019-09-08 MED ORDER — POTASSIUM CHLORIDE CRYS ER 20 MEQ PO TBCR
40.0000 meq | EXTENDED_RELEASE_TABLET | Freq: Once | ORAL | Status: AC
  Administered 2019-09-08: 40 meq via ORAL
  Filled 2019-09-08: qty 2

## 2019-09-08 MED ORDER — FUROSEMIDE 10 MG/ML IJ SOLN
60.0000 mg | Freq: Once | INTRAMUSCULAR | Status: AC
  Administered 2019-09-08: 15:00:00 60 mg via INTRAVENOUS
  Filled 2019-09-08: qty 6

## 2019-09-08 NOTE — Progress Notes (Signed)
Pt 02 demand increasing 8-10-12L HFNC for sp02 80%s while sleeping prone. Pt sat up, c/o occluded nares- relived by saline spray, however with exertion requiring 15L HFNC and 10L NRB to maintain sp02 90%s. No acute distress noted, will reattempt to wean 02 when appropriate.

## 2019-09-08 NOTE — Progress Notes (Signed)
[facility] PROGRESS NOTE    Progress Note  GABRYEL FILES  EHU:[id number] DOB: 01/31/1971 DOA: 08/27/2019 PCP: Patient, No Pcp Per     Brief Narrative:   Henry Miller is an 49 y.o. male past medical history of prediabetes, DVT PE presents with a 1 day history of worsening shortness of breath he relates he tested positive for SARS-CoV-2 on 08/26/2019.  Prior to this he had been having mild symptoms of nonproductive cough and a low-grade fever accompanied by nausea vomiting diarrhea.  Checked his saturations at home and was 86% on room air, EMS placed him in 6 L and improved to 89 found tachycardic in the ED and satting 89% on 6 L we will start on remdesivir and steroids oxygen supplementation was increased to 15 L and saturations improved.  Actemra has been given  Assessment/Plan:   Acute respiratory failure with hypoxia due to Pneumonia due to COVID-19 virus  Recent Labs  Lab 09/04/19 0400 09/05/19 0012 09/06/19 0109 09/07/19 0436 09/08/19 0220  DDIMER 5.06* 14.13* 3.17* 3.33* 2.71*  CRP 0.5  --   --   --   --   ALT  --  44 43 42 44  PROCALCITON  --  <0.10 <0.10  --   --     Patient was hospitalized and placed on remdesivir and steroids.  He was also given 2 doses of Actemra.  He remains on dexamethasone.  Patient continues to require high amounts of oxygen although he is noted to be on just 15 L HFNC this morning.  He was on 30 L of heated high flow yesterday.  Patient states that he is feeling better.  Perhaps he is getting slightly better.  Chest x-ray done this morning shows slight improvement in the right lung but slight worsening in the left lung.  Continue to monitor.    CRP had normalized.  D-dimer improved to 2.71.  He did undergo a CT angiogram which did not show any PE.  Progressive changes in his pneumonia were seen.  Lower extremity Dopplers did not show any DVT. Procalcitonin was less than 0.1  Lower extremity Doppler studies did not show any DVT.  CT  angiogram was repeated and did not show any PE.  Progressive changes in his bilateral pneumonia was seen.  Patient has also received Lasix the last many days and this will be repeated.  Epistaxis has resolved.  Continue with nasal spray and Flonase.  Continue inhalers.  Patient is being very compliant with incentive spirometry prone positioning and mobilization.  Diabetes mellitus type 2, uncontrolled with hyperglycemia  HbA1c 9.9.  Patient was on Lantus and SSI.  However he developed a few episodes of hypoglycemia so his Lantus was discontinued.  CBGs now greater than 180 and mostly greater than 200.  We will put him back on low-dose Lantus.  His appetite seems to be improving.  Plus he remains on steroids.  Morbid obesity Estimated body mass index is 38.83 kg/m as calculated from the following:   Height as of this encounter: 5\' 10"  (1.778 m).   Weight as of this encounter: 122.7 kg.  Hypokalemia: Repleted.  Monitor while he is getting Lasix.     DVT prophylaxis: lovenox CODE STATUS: Full code Family Communication:none Disposition Plan: Very slow to improve.    IV Access:    Peripheral IV   Procedures and diagnostic studies:   DG CHEST PORT 1 VIEW  Result Date: 09/08/2019 CLINICAL DATA:  Shortness of breath, COVID pneumonia  EXAM: PORTABLE CHEST 1 VIEW COMPARISON:  Chest radiograph dated 09/04/2019 and CT chest dated 09/05/2019 FINDINGS: The heart size and mediastinal contours are within normal limits. Moderate to severe bilateral, lower lung predominant, airspace opacities have decreased on the right and have increased on the left compared to 09/04/2019. There is no pleural effusion or pneumothorax. The visualized skeletal structures are unremarkable. IMPRESSION: Moderate to severe bilateral, lower lung predominant, airspace opacities have decreased on the right and have increased on the left compared to 09/04/2019. These findings are consistent with COVID-19 pneumonia.  Electronically Signed   By: Zerita Boers M.D.   On: 09/08/2019 09:23     Medical Consultants:    None.  Anti-Infectives:   Anti-infectives (From admission, onward)   Start     Dose/Rate Route Frequency Ordered Stop   08/29/19 1000  remdesivir 100 mg in sodium chloride 0.9 % 100 mL IVPB     100 mg 200 mL/hr over 30 Minutes Intravenous Daily 08/28/19 0042 09/01/19 1100   08/28/19 0300  remdesivir 200 mg in sodium chloride 0.9% 250 mL IVPB     200 mg 580 mL/hr over 30 Minutes Intravenous Once 08/28/19 0042 08/28/19 0413       Subjective:   Patient states that he is feeling better.  Still gets very short of breath with minimal exertion.  Dry cough.  Denies any chest pain.  In good spirits.  Objective:    Vitals:   09/08/19 0410 09/08/19 0800 09/08/19 0854 09/08/19 1152  BP: 128/76 108/69  105/71  Pulse: 95 98  (!) 120  Resp: (!) 31 20  (!) 30  Temp: 97.8 F (36.6 C) 97.6 F (36.4 C)  97.9 F (36.6 C)  TempSrc: Oral Oral  Oral  SpO2: 91% 92%  91%  Weight:   122.7 kg   Height:       SpO2: 91 % O2 Flow Rate (L/min): 12 L/min FiO2 (%): 100 %   Intake/Output Summary (Last 24 hours) at 09/08/2019 1336 Last data filed at 09/08/2019 1100 Gross per 24 hour  Intake 885 ml  Output 550 ml  Net 335 ml   Filed Weights   08/27/19 1945 09/08/19 0854  Weight: 133.8 kg 122.7 kg    General appearance: Awake alert.  In no distress Resp: Tachypneic.  No use of accessory muscles.  Crackles bilateral bases.  No wheezing or rhonchi.  Slightly improved air entry bilaterally.   Cardio: S1-S2 is normal regular.  No S3-S4.  No rubs murmurs or bruit GI: Abdomen is soft.  Nontender nondistended.  Bowel sounds are present normal.  No masses organomegaly Extremities: No edema.  Full range of motion of lower extremities. Neurologic: Alert and oriented x3.  No focal neurological deficits.     Data Reviewed:    Labs: Basic Metabolic Panel: Recent Labs  Lab 09/03/19 0500  09/04/19 0400 09/05/19 0012 09/06/19 0109 09/07/19 0436 09/08/19 0220  NA 136 140 136 135 136 136  K 2.4* 3.4* 4.3 3.7 3.7 3.5  CL 101 101 100 95* 97* 97*  CO2 23 26 22 25 26 26   GLUCOSE 48* 58* 95 127* 174* 153*  BUN 11 9 11 14 14 15   CREATININE 0.73 0.77 0.80 0.71 0.80 0.79  CALCIUM 8.0* 8.4* 8.0* 8.3* 8.4* 8.6*  MG 2.1  --   --   --   --   --    GFR Estimated Creatinine Clearance: 150 mL/min (by C-G formula based on SCr of 0.79 mg/dL). Liver  Function Tests: Recent Labs  Lab 09/05/19 0012 09/06/19 0109 09/07/19 0436 09/08/19 0220  AST 45* 36 32 26  ALT 44 43 42 44  ALKPHOS 62 76 76 71  BILITOT 0.7 1.3* 1.1 0.9  PROT 5.3* 6.1* 6.0* 6.0*  ALBUMIN 3.0* 3.4* 3.4* 3.5   COVID-19 Labs  Recent Labs    09/06/19 0109 09/07/19 0436 09/08/19 0220  DDIMER 3.17* 3.33* 2.71*    Lab Results  Component Value Date   SARSCOV2NAA Detected (A) 08/26/2019    CBC: Recent Labs  Lab 09/02/19 0435 09/05/19 0012 09/06/19 0109 09/07/19 0436 09/08/19 0220  WBC 7.9 6.6 10.8* 8.8 9.8  NEUTROABS 6.5  --   --   --   --   HGB 15.5 15.7 16.8 16.0 15.9  HCT 45.2 46.6 49.7 47.9 47.6  MCV 85.8 87.1 88.0 88.1 88.1  PLT 326 240 283 248 225   CBG: Recent Labs  Lab 09/07/19 1204 09/07/19 1611 09/07/19 2020 09/08/19 0754 09/08/19 1149  GLUCAP 220* 268* 260* 184* 279*   D-Dimer: Recent Labs    09/07/19 0436 09/08/19 0220  DDIMER 3.33* 2.71*   Sepsis Labs: Recent Labs  Lab 09/05/19 0012 09/06/19 0109 09/07/19 0436 09/08/19 0220  PROCALCITON <0.10 <0.10  --   --   WBC 6.6 10.8* 8.8 9.8   Microbiology No results found for this or any previous visit (from the past 240 hour(s)).   Medications:   . vitamin C  500 mg Oral Daily  . dexamethasone (DECADRON) injection  6 mg Intravenous Q24H  . enoxaparin (LOVENOX) injection  65 mg Subcutaneous Q12H  . fluticasone  2 spray Each Nare Daily  . insulin aspart  0-20 Units Subcutaneous TID WC  . insulin aspart  0-5 Units  Subcutaneous QHS  . Ipratropium-Albuterol  4 puff Inhalation QID  . mometasone-formoterol  2 puff Inhalation BID  . pantoprazole  40 mg Oral BID  . potassium chloride  20 mEq Oral Daily  . zinc sulfate  220 mg Oral Daily   Continuous Infusions:     LOS: 12 days   [employer]  [facility]  09/08/2019, 1:36 PM

## 2019-09-08 NOTE — Progress Notes (Signed)
Pt facetiming with wife. Nursing updates offered this shift, pt declined need- states MD updated family today. Pt is self-proning at this time, 02 weaned as tolerated.

## 2019-09-08 NOTE — Progress Notes (Signed)
   09/08/19 1959  Provider Notification  Provider Name/Title Emelda Brothers   Date Provider Notified 09/08/19  Time Provider Notified [phone]  Notification Type Page  Notification Reason Other (Comment) (mews yellow, sinus tachycardia- 4puffs combivent due qid)  Response See new orders;Other (Comment) (discussed with MD via telephone)  Date of Provider Response 09/08/19  Time of Provider Response 2011  Sustained sinus tachycardia this shift HR 120s- asymptomatic, mews yellow score of 2. Pt is due for combivent 4puff, ordered QID- hold pm dose for tachycardia per MD. No change in HR noted with exertion/transfer to [facility], remains 120s and pt tolerated well. sp02 maintains low 90%s on 12L HFNC.

## 2019-09-08 NOTE — Progress Notes (Signed)
ANTICOAGULATION CONSULT NOTE  Pharmacy Consult for enoxaparin Indication: VTE prophylaxis in setting of COVID 19  No Known Allergies  Patient Measurements: Height: 5\' 10"  (177.8 cm) Weight: 270 lb 9.6 oz (122.7 kg) IBW/kg (Calculated) : 73  BMI >35  Labs: Recent Labs    09/06/19 0109 09/07/19 0436 09/08/19 0220  HGB 16.8 16.0 15.9  HCT 49.7 47.9 47.6  PLT 283 248 225  CREATININE 0.71 0.80 0.79    Estimated Creatinine Clearance: 150 mL/min (by C-G formula based on SCr of 0.79 mg/dL).   Assessment: Pharmacy initially consulted for Lovenox for VTE prophylaxis for d-dimer >5; it is now <5. No bleeding noted, CBC stable.  Goal of Therapy:  Monitor platelets by anticoagulation protocol: Yes   Plan:  Continue enoxaparin 65 mg SQ q12h for now Consider decreasing frequency to q24h with clinical improvement  Pharmacy signing off but will continue to follow peripherally   Thank you for involving pharmacy in this patient's care.  11/06/19, PharmD, BCPS Clinical Pharmacist Clinical phone for 09/08/2019 until 3:30p is 11/06/2019 09/08/2019 10:20 AM  **Pharmacist phone directory can be found on [email or web address] listed under [facility]**

## 2019-09-08 NOTE — Plan of Care (Signed)
Problem: Fluid Volume: Goal: Ability to maintain a balanced intake and output will improve Outcome: Progressing   Problem: Nutritional: Goal: Maintenance of adequate nutrition will improve Outcome: Progressing   Problem: Skin Integrity: Goal: Risk for impaired skin integrity will decrease Outcome: Progressing   Problem: Tissue Perfusion: Goal: Adequacy of tissue perfusion will improve Outcome: Progressing   Problem: Education: Goal: Knowledge of General Education information will improve Description: Including pain rating scale, medication(s)/side effects and non-pharmacologic comfort measures Outcome: Progressing   Problem: Clinical Measurements: Goal: Diagnostic test results will improve Outcome: Progressing Goal: Respiratory complications will improve Outcome: Progressing   Problem: Activity: Goal: Risk for activity intolerance will decrease Outcome: Progressing   Problem: Coping: Goal: Level of anxiety will decrease Outcome: Progressing   Problem: Safety: Goal: Ability to remain free from injury will improve Outcome: Progressing

## 2019-09-08 NOTE — Plan of Care (Signed)
POC discussed with pt.A&O x4. No c/o pain. On 12L HFNC, pt puts on NRB when he feels SOB/when ambulating. Afebrile, NSR/ST. BP stable. No BM this shift, insulin coverage for elevated BG. Voids in urinal/ uses BSC. Pt self prones. No other issues noted at this time.

## 2019-09-09 LAB — COMPREHENSIVE METABOLIC PANEL
ALT: 58 U/L — ABNORMAL HIGH (ref 0–44)
AST: 30 U/L (ref 15–41)
Albumin: 3.7 g/dL (ref 3.5–5.0)
Alkaline Phosphatase: 74 U/L (ref 38–126)
Anion gap: 19 — ABNORMAL HIGH (ref 5–15)
BUN: 17 mg/dL (ref 6–20)
CO2: 27 mmol/L (ref 22–32)
Calcium: 8.8 mg/dL — ABNORMAL LOW (ref 8.9–10.3)
Chloride: 96 mmol/L — ABNORMAL LOW (ref 98–111)
Creatinine, Ser: 0.88 mg/dL (ref 0.61–1.24)
GFR calc Af Amer: >60 mL/min (ref >60)
GFR calc non Af Amer: >60 mL/min (ref >60)
Glucose, Bld: 148 mg/dL — ABNORMAL HIGH (ref 70–99)
Potassium: 3.8 mmol/L (ref 3.5–5.1)
Sodium: 142 mmol/L (ref 135–145)
Total Bilirubin: 1 mg/dL (ref 0.3–1.2)
Total Protein: 6.1 g/dL — ABNORMAL LOW (ref 6.5–8.1)

## 2019-09-09 LAB — CBC
HCT: 48 % (ref 39.0–52.0)
Hemoglobin: 15.9 g/dL (ref 13.0–17.0)
MCH: 29.6 pg (ref 26.0–34.0)
MCHC: 33.1 g/dL (ref 30.0–36.0)
MCV: 89.2 fL (ref 80.0–100.0)
Platelets: 237 10*3/uL (ref 150–400)
RBC: 5.38 MIL/uL (ref 4.22–5.81)
RDW: 13.9 % (ref 11.5–15.5)
WBC: 11.1 10*3/uL — ABNORMAL HIGH (ref 4.0–10.5)
nRBC: 0 % (ref 0.0–0.2)

## 2019-09-09 LAB — GLUCOSE, CAPILLARY
Glucose-Capillary: 167 mg/dL — ABNORMAL HIGH (ref 70–99)
Glucose-Capillary: 364 mg/dL — ABNORMAL HIGH (ref 70–99)
Glucose-Capillary: 299 mg/dL — ABNORMAL HIGH (ref 70–99)

## 2019-09-09 LAB — D-DIMER, QUANTITATIVE: D-Dimer, Quant: 2.71 ug/mL-FEU — ABNORMAL HIGH (ref 0.00–0.50)

## 2019-09-09 MED ORDER — IPRATROPIUM-ALBUTEROL 20-100 MCG/ACT IN AERS
2.0000 | INHALATION_SPRAY | Freq: Four times a day (QID) | RESPIRATORY_TRACT | Status: DC
  Administered 2019-09-09 – 2019-09-11 (×8): 2 via RESPIRATORY_TRACT
  Filled 2019-09-09 (×16): qty 4

## 2019-09-09 MED ORDER — FUROSEMIDE 10 MG/ML IJ SOLN
60.0000 mg | Freq: Once | INTRAMUSCULAR | Status: AC
  Administered 2019-09-09: 12:00:00 60 mg via INTRAVENOUS
  Filled 2019-09-09: qty 6

## 2019-09-09 MED ORDER — POTASSIUM CHLORIDE CRYS ER 20 MEQ PO TBCR
40.0000 meq | EXTENDED_RELEASE_TABLET | Freq: Once | ORAL | Status: AC
  Administered 2019-09-09: 16:00:00 40 meq via ORAL
  Filled 2019-09-09: qty 2

## 2019-09-09 NOTE — Progress Notes (Signed)
Inpatient Diabetes Program Recommendations  AACE/ADA: New Consensus Statement on Inpatient Glycemic Control (2015)  Target Ranges:  Prepandial:   less than 140 mg/dL      Peak postprandial:   less than 180 mg/dL (1-2 hours)      Critically ill patients:  140 - 180 mg/dL   Lab Results  Component Value Date   GLUCAP 364 (H) 09/09/2019   HGBA1C 9.9 (H) 08/29/2019    Review of Glycemic Control Results for EFFIE, JANOSKI (MRN [id number]) as of 09/09/2019 15:03  Ref. Range 09/08/2019 21:00 09/09/2019 07:30 09/09/2019 11:17  Glucose-Capillary Latest Ref Range: 70 - 99 mg/dL 493 (H) 552 (H) 174 (H)   Diabetes history: DM 2 Outpatient Diabetes medications: None Current orders for Inpatient glycemic control:  Lantus 8 units daily, Novolog resistant tid with meals and HS Decadron 6 mg daily Inpatient Diabetes Program Recommendations:    Please consider adding Novolog meal coverage 3 units tid w/ meals (hold if patient eats less than 50%).   Thanks Beryl Meager, RN, BC-ADM Inpatient Diabetes Coordinator Pager [phone] (8a-5p)

## 2019-09-09 NOTE — Progress Notes (Signed)
[facility] PROGRESS NOTE    Progress Note  Henry Miller  IEP:[id number] DOB: 1971-06-29 DOA: 08/27/2019 PCP: Patient, No Pcp Per     Brief Narrative:   Henry Miller is an 49 y.o. male past medical history of prediabetes, DVT PE presents with a 1 day history of worsening shortness of breath he relates he tested positive for SARS-CoV-2 on 08/26/2019.  Prior to this he had been having mild symptoms of nonproductive cough and a low-grade fever accompanied by nausea vomiting diarrhea.  Checked his saturations at home and was 86% on room air, EMS placed him in 6 L and improved to 89 found tachycardic in the ED and satting 89% on 6 L we will start on remdesivir and steroids oxygen supplementation was increased to 15 L and saturations improved.  Actemra has been given  Assessment/Plan:   Acute respiratory failure with hypoxia due to Pneumonia due to COVID-19 virus  Recent Labs  Lab 09/04/19 0400 09/05/19 0012 09/06/19 0109 09/07/19 0436 09/08/19 0220 09/09/19 0320  DDIMER 5.06* 14.13* 3.17* 3.33* 2.71* 2.71*  CRP 0.5  --   --   --   --   --   ALT  --  44 43 42 44 58*  PROCALCITON  --  <0.10 <0.10  --   --   --     Patient was hospitalized and placed on remdesivir and steroids.  He was also given 2 doses of Actemra.  He remains on dexamethasone.  Patient continues to require high amounts of oxygen.  He has been on high flow nasal cannula at 15 L.  Also noted to be on a nonrebreather this morning.  Chest x-ray done yesterday showed slight improvement in the right lung but worsening in the left lung.  Continue to monitor.  Inflammatory markers had improved.  CT angiogram did not show any PE.  Lower extremity Dopplers did not show any DVT.  Procalcitonin was less than 1.  He has been getting Lasix daily for the past few days.  This will be repeated today.  Epistaxis appears to have resolved.  He continues to have nasal congestion.  Continue Flonase and nasal spray.  Continue  inhaler.  Patient very compliant with his incentive spirometer, prone positioning and mobilization.    Diabetes mellitus type 2, uncontrolled with hyperglycemia  HbA1c 9.9.  Patient was on Lantus and SSI.  However he developed a few episodes of hypoglycemia so his Lantus was discontinued.  Subsequently CBGs started climbing again.  Lantus was reintroduced yesterday.  Continue to monitor.  Part of the reason for the high glucose levels is steroids.  Morbid obesity Estimated body mass index is 41.94 kg/m as calculated from the following:   Height as of this encounter: 5\' 10"  (1.778 m).   Weight as of this encounter: 132.6 kg.  Hypokalemia: Repleted.  Monitor while he is getting Lasix.     DVT prophylaxis: lovenox CODE STATUS: Full code Family Communication:none Disposition Plan: Very slow to improve.    IV Access:    Peripheral IV   Procedures and diagnostic studies:   DG CHEST PORT 1 VIEW  Result Date: 09/08/2019 CLINICAL DATA:  Shortness of breath, COVID pneumonia EXAM: PORTABLE CHEST 1 VIEW COMPARISON:  Chest radiograph dated 09/04/2019 and CT chest dated 09/05/2019 FINDINGS: The heart size and mediastinal contours are within normal limits. Moderate to severe bilateral, lower lung predominant, airspace opacities have decreased on the right and have increased on the left compared to 09/04/2019. There  is no pleural effusion or pneumothorax. The visualized skeletal structures are unremarkable. IMPRESSION: Moderate to severe bilateral, lower lung predominant, airspace opacities have decreased on the right and have increased on the left compared to 09/04/2019. These findings are consistent with COVID-19 pneumonia. Electronically Signed   By: Zerita Boers M.D.   On: 09/08/2019 09:23     Medical Consultants:    None.  Anti-Infectives:   Anti-infectives (From admission, onward)   Start     Dose/Rate Route Frequency Ordered Stop   08/29/19 1000  remdesivir 100 mg in sodium  chloride 0.9 % 100 mL IVPB     100 mg 200 mL/hr over 30 Minutes Intravenous Daily 08/28/19 0042 09/01/19 1100   08/28/19 0300  remdesivir 200 mg in sodium chloride 0.9% 250 mL IVPB     200 mg 580 mL/hr over 30 Minutes Intravenous Once 08/28/19 0042 08/28/19 0413       Subjective:   Patient states that he is feeling well.  Denies any new complaints.  Gets very short of breath even with minimal exertion.  Objective:    Vitals:   09/09/19 0015 09/09/19 0020 09/09/19 0306 09/09/19 0515  BP: 108/84  99/63   Pulse: 85  89   Resp: 20  20   Temp: 98.8 F (37.1 C)  98 F (36.7 C)   TempSrc: Oral  Oral   SpO2: 96% 91% 91%   Weight:    132.6 kg  Height:       SpO2: 91 % O2 Flow Rate (L/min): 15 L/min FiO2 (%): 100 %   Intake/Output Summary (Last 24 hours) at 09/09/2019 1036 Last data filed at 09/08/2019 2000 Gross per 24 hour  Intake 600 ml  Output 2100 ml  Net -1500 ml   Filed Weights   08/27/19 1945 09/08/19 0854 09/09/19 0515  Weight: 133.8 kg 122.7 kg 132.6 kg    General appearance: Awake alert.  In no distress Resp: Noted to be tachypneic.  Sitting up in the chair.  No use of accessory muscles.  Coarse breath sounds with crackles bilateral bases.  No wheezing or rhonchi. Cardio: S1-S2 is normal regular.  No S3-S4.  No rubs murmurs or bruit GI: Abdomen is soft.  Nontender nondistended.  Bowel sounds are present normal.  No masses organomegaly Extremities: No edema.  Full range of motion of lower extremities. Neurologic: Alert and oriented x3.  No focal neurological deficits.     Data Reviewed:    Labs: Basic Metabolic Panel: Recent Labs  Lab 09/03/19 0500 09/05/19 0012 09/06/19 0109 09/07/19 0436 09/08/19 0220 09/09/19 0320  NA 136 136 135 136 136 142  K 2.4* 4.3 3.7 3.7 3.5 3.8  CL 101 100 95* 97* 97* 96*  CO2 23 22 25 26 26 27   GLUCOSE 48* 95 127* 174* 153* 148*  BUN 11 11 14 14 15 17   CREATININE 0.73 0.80 0.71 0.80 0.79 0.88  CALCIUM 8.0* 8.0*  8.3* 8.4* 8.6* 8.8*  MG 2.1  --   --   --   --   --    GFR Estimated Creatinine Clearance: 142.1 mL/min (by C-G formula based on SCr of 0.88 mg/dL). Liver Function Tests: Recent Labs  Lab 09/05/19 0012 09/06/19 0109 09/07/19 0436 09/08/19 0220 09/09/19 0320  AST 45* 36 32 26 30  ALT 44 43 42 44 58*  ALKPHOS 62 76 76 71 74  BILITOT 0.7 1.3* 1.1 0.9 1.0  PROT 5.3* 6.1* 6.0* 6.0* 6.1*  ALBUMIN 3.0* 3.4*  3.4* 3.5 3.7   COVID-19 Labs  Recent Labs    09/07/19 0436 09/08/19 0220 09/09/19 0320  DDIMER 3.33* 2.71* 2.71*    Lab Results  Component Value Date   SARSCOV2NAA Detected (A) 08/26/2019    CBC: Recent Labs  Lab 09/05/19 0012 09/06/19 0109 09/07/19 0436 09/08/19 0220 09/09/19 0320  WBC 6.6 10.8* 8.8 9.8 11.1*  HGB 15.7 16.8 16.0 15.9 15.9  HCT 46.6 49.7 47.9 47.6 48.0  MCV 87.1 88.0 88.1 88.1 89.2  PLT 240 283 248 225 237   CBG: Recent Labs  Lab 09/08/19 0754 09/08/19 1149 09/08/19 1601 09/08/19 2100 09/09/19 0730  GLUCAP 184* 279* 273* 228* 167*   D-Dimer: Recent Labs    09/08/19 0220 09/09/19 0320  DDIMER 2.71* 2.71*   Sepsis Labs: Recent Labs  Lab 09/05/19 0012 09/06/19 0109 09/07/19 0436 09/08/19 0220 09/09/19 0320  PROCALCITON <0.10 <0.10  --   --   --   WBC 6.6 10.8* 8.8 9.8 11.1*   Microbiology No results found for this or any previous visit (from the past 240 hour(s)).   Medications:   . vitamin C  500 mg Oral Daily  . dexamethasone (DECADRON) injection  6 mg Intravenous Q24H  . enoxaparin (LOVENOX) injection  65 mg Subcutaneous Q12H  . fluticasone  2 spray Each Nare Daily  . insulin aspart  0-20 Units Subcutaneous TID WC  . insulin aspart  0-5 Units Subcutaneous QHS  . insulin glargine  8 Units Subcutaneous Daily  . Ipratropium-Albuterol  2 puff Inhalation QID  . mometasone-formoterol  2 puff Inhalation BID  . pantoprazole  40 mg Oral BID  . potassium chloride  20 mEq Oral Daily  . zinc sulfate  220 mg Oral Daily    Continuous Infusions:     LOS: 13 days   [employer]  [facility]  09/09/2019, 10:36 AM

## 2019-09-09 NOTE — Progress Notes (Signed)
Spouse Heather contacted at this time for updates, states MD provided updates as well this afternoon- no questions at this time. Patient was able to facetime with spouse this hs.

## 2019-09-09 NOTE — Progress Notes (Signed)
Physical Therapy Treatment Patient Details Name: Henry Miller MRN: [id number] DOB: [birth year] Today's Date: 09/09/2019    History of Present Illness 49 y.o. male admitted on 08/27/19 for acute respiratory failure with hypoxia due to COVID 19 viral PNA, hypokalemia.  Pt with significant PMH of DM2, morbid obesity.     PT Comments    Pt making steady progress. Able to tolerate amb short distance in room on 15L HFNC.   Follow Up Recommendations  Supervision - Intermittent;No PT follow up     Equipment Recommendations  Other (comment)(likely home O2)    Recommendations for Other Services       Precautions / Restrictions Precautions Precautions: Other (comment) Precaution Comments: 15L HFNC    Mobility  Bed Mobility               General bed mobility comments: Pt up in chair  Transfers Overall transfer level: Modified independent                  Ambulation/Gait Ambulation/Gait assistance: Supervision Gait Distance (Feet): 40 Feet Assistive device: None Gait Pattern/deviations: Step-through pattern;Decreased stride length Gait velocity: decr Gait velocity interpretation: <1.31 ft/sec, indicative of household ambulator(due to lines and confined space) General Gait Details: supervision for lines and to monitor SpO2   Stairs             Wheelchair Mobility    Modified Rankin (Stroke Patients Only)       Balance Overall balance assessment: No apparent balance deficits (not formally assessed)                                          Cognition Arousal/Alertness: Awake/alert Behavior During Therapy: WFL for tasks assessed/performed Overall Cognitive Status: Within Functional Limits for tasks assessed                                        Exercises      General Comments General comments (skin integrity, edema, etc.): Pt on HFNC at 15L. SpO2 at rest mid 90's. With activity SpO2 dropped to low 80's. Pt able  to recover back to 90% after 1.5-2 minutes of pursed lip breathing      Pertinent Vitals/Pain Pain Assessment: No/denies pain    Home Living                      Prior Function            PT Goals (current goals can now be found in the care plan section) Acute Rehab PT Goals Patient Stated Goal: to get home and get better so he can get back to work and his hobbies Progress towards PT goals: Progressing toward goals    Frequency    Min 3X/week      PT Plan Discharge plan needs to be updated    Co-evaluation              AM-PAC PT "6 Clicks" Mobility   Outcome Measure  Help needed turning from your back to your side while in a flat bed without using bedrails?: None Help needed moving from lying on your back to sitting on the side of a flat bed without using bedrails?: None Help needed moving to and from a bed to a chair (including  a wheelchair)?: None Help needed standing up from a chair using your arms (e.g., wheelchair or bedside chair)?: None Help needed to walk in hospital room?: A Little Help needed climbing 3-5 steps with a railing? : A Little 6 Click Score: 22    End of Session Equipment Utilized During Treatment: Oxygen Activity Tolerance: Treatment limited secondary to medical complications (Comment)(dyspnea and low SpO2) Patient left: in chair Nurse Communication: Mobility status PT Visit Diagnosis: Difficulty in walking, not elsewhere classified (R26.2)     Time: 3299-2426 PT Time Calculation (min) (ACUTE ONLY): 14 min  Charges:  $Gait Training: 8-22 mins                     [place] Pager [phone] Office [place] 09/09/2019, 4:29 PM

## 2019-09-10 LAB — BASIC METABOLIC PANEL
Anion gap: 14 (ref 5–15)
BUN: 19 mg/dL (ref 6–20)
CO2: 27 mmol/L (ref 22–32)
Calcium: 9.3 mg/dL (ref 8.9–10.3)
Chloride: 96 mmol/L — ABNORMAL LOW (ref 98–111)
Creatinine, Ser: 0.91 mg/dL (ref 0.61–1.24)
GFR calc Af Amer: >60 mL/min (ref >60)
GFR calc non Af Amer: >60 mL/min (ref >60)
Glucose, Bld: 158 mg/dL — ABNORMAL HIGH (ref 70–99)
Potassium: 3.7 mmol/L (ref 3.5–5.1)
Sodium: 137 mmol/L (ref 135–145)

## 2019-09-10 LAB — GLUCOSE, CAPILLARY
Glucose-Capillary: 225 mg/dL — ABNORMAL HIGH (ref 70–99)
Glucose-Capillary: 183 mg/dL — ABNORMAL HIGH (ref 70–99)
Glucose-Capillary: 300 mg/dL — ABNORMAL HIGH (ref 70–99)
Glucose-Capillary: 334 mg/dL — ABNORMAL HIGH (ref 70–99)
Glucose-Capillary: 309 mg/dL — ABNORMAL HIGH (ref 70–99)

## 2019-09-10 MED ORDER — FUROSEMIDE 10 MG/ML IJ SOLN
40.0000 mg | Freq: Once | INTRAMUSCULAR | Status: AC
  Administered 2019-09-10: 40 mg via INTRAVENOUS
  Filled 2019-09-10: qty 4

## 2019-09-10 MED ORDER — DEXAMETHASONE SODIUM PHOSPHATE 4 MG/ML IJ SOLN
4.0000 mg | INTRAMUSCULAR | Status: DC
  Administered 2019-09-11 – 2019-09-12 (×2): 4 mg via INTRAVENOUS
  Filled 2019-09-10 (×2): qty 1

## 2019-09-10 NOTE — Plan of Care (Signed)
  Problem: Metabolic: Goal: Ability to maintain appropriate glucose levels will improve Outcome: Progressing   Problem: Nutritional: Goal: Maintenance of adequate nutrition will improve Outcome: Progressing   Problem: Skin Integrity: Goal: Risk for impaired skin integrity will decrease Outcome: Progressing   Problem: Tissue Perfusion: Goal: Adequacy of tissue perfusion will improve Outcome: Progressing   Problem: Activity: Goal: Risk for activity intolerance will decrease Outcome: Progressing   Problem: Coping: Goal: Level of anxiety will decrease Outcome: Progressing   Problem: Elimination: Goal: Will not experience complications related to bowel motility Outcome: Progressing   Problem: Safety: Goal: Ability to remain free from injury will improve Outcome: Progressing

## 2019-09-10 NOTE — Progress Notes (Signed)
Ambulation Note  Saturation Pre: 94% on 12 L HFNC  Ambulation Distance: 25 ft x 3  Saturation During Ambulation: 86-92% on 15L  Notes: Pt walked independently doing laps around the bed. Tolerated well. Sats maintained at 86-92% while walking on 15 L. Sats would drop to the high 70s upon sitting. It would take around 2-3 minutes for sats to recover into the 90s. Pt returned to recliner on 12 L HFNC with sats at 95% and call bell within reach. Pt eager to walk again later today.  Alisia Ferrari, MS, ACSM CEP 9:50 AM 09/10/2019

## 2019-09-10 NOTE — Progress Notes (Signed)
Patient is a Yellow MEWS. Dr Rito Ehrlich notified. This patient has decreased SAO2 with any movement and tachycardia. Patient recovers with rest. The patient is up in the chair. Education provided on the importance of using the Facilities manager. Patient uses the Non rebreather intermittently to recover after movement. Patient expressed that he is feeling better today.

## 2019-09-10 NOTE — Progress Notes (Signed)
Inpatient Diabetes Program Recommendations  AACE/ADA: New Consensus Statement on Inpatient Glycemic Control (2015)  Target Ranges:  Prepandial:   less than 140 mg/dL      Peak postprandial:   less than 180 mg/dL (1-2 hours)      Critically ill patients:  140 - 180 mg/dL   Lab Results  Component Value Date   GLUCAP 183 (H) 09/10/2019   HGBA1C 9.9 (H) 08/29/2019    Review of Glycemic Control Results for Henry Miller, Henry Miller (MRN [id number]) as of 09/10/2019 10:27  Ref. Range 09/09/2019 07:30 09/09/2019 11:17 09/09/2019 16:16 09/09/2019 21:17 09/10/2019 07:50  Glucose-Capillary Latest Ref Range: 70 - 99 mg/dL 703 (H) 403 (H) 524 (H) 225 (H) 183 (H)    Diabetes history: DM 2 Outpatient Diabetes medications: None Current orders for Inpatient glycemic control:  Lantus 8 units daily Novolog 0-20 units tid with meals and HS  Decadron 6 mg daily BUN/Creat: 19/0.91  Inpatient Diabetes Program Recommendations:    Please consider adding Novolog meal coverage 3 units tid w/ meals (hold if patient eats less than 50%).   Thanks Christena Deem RN, MSN, BC-ADM Inpatient Diabetes Coordinator Team Pager [phone] (8a-5p)

## 2019-09-10 NOTE — Progress Notes (Signed)
[facility] PROGRESS NOTE    Progress Note  Henry Miller  MEQ:[id number] DOB: 1971/02/28 DOA: 08/27/2019 PCP: Patient, No Pcp Per     Brief Narrative:   Henry Miller is an 49 y.o. male past medical history of prediabetes, DVT PE presents with a 1 day history of worsening shortness of breath he relates he tested positive for SARS-CoV-2 on 08/26/2019.  Prior to this he had been having mild symptoms of nonproductive cough and a low-grade fever accompanied by nausea vomiting diarrhea.  Checked his saturations at home and was 86% on room air, EMS placed him in 6 L and improved to 89 found tachycardic in the ED and satting 89% on 6 L we will start on remdesivir and steroids oxygen supplementation was increased to 15 L and saturations improved.  Actemra has been given  Assessment/Plan:   Acute respiratory failure with hypoxia due to Pneumonia due to COVID-19 virus Patient was hospitalized and placed on remdesivir and steroids.  He was also given 2 doses of Actemra.  He remains on dexamethasone.  We can start tapering dose.  Patient continues to require high amounts of oxygen.  He said that he did well for majority of the day yesterday on 12 L.  Noted to be on 12 L high flow as well as NRB this morning.  That was mainly because he had exerted himself and had desaturated briefly.  Patient states that he otherwise feels well.  Patient's inflammatory markers had improved.  CT angiogram did not show any PE.  Lower extremity Dopplers did not show any DVT.  Procalcitonin was less than 0.1.  Patient has been getting daily diuretics for the last many days.  Will be repeated today.  He also had some epistaxis which has resolved.  Continue Flonase and nasal spray.  Continue inhalers.  Patient is very compliant with his incentive spirometer, prone positioning and mobilization.    We will repeat chest x-ray tomorrow morning.  Diabetes mellitus type 2, uncontrolled with hyperglycemia  HbA1c 9.9.   Patient was on Lantus and SSI.  However he developed a few episodes of hypoglycemia so his Lantus was discontinued.  Subsequently CBGs started climbing again.  Lantus was reintroduced.  Continue current dose for now as the plan is to start tapering his steroids with which we anticipate his glucose levels to start dropping.    Morbid obesity Estimated body mass index is 38.6 kg/m as calculated from the following:   Height as of this encounter: 5\' 10"  (1.778 m).   Weight as of this encounter: 122 kg.  Hypokalemia: Repleted.  Monitor while he is getting Lasix.     DVT prophylaxis: lovenox CODE STATUS: Full code Family Communication:none Disposition Plan: Very slow to improve.  Hopefully return home when improved.  Continue to mobilize.    IV Access:    Peripheral IV   Procedures and diagnostic studies:   No results found.   Medical Consultants:    None.  Anti-Infectives:   Anti-infectives (From admission, onward)   Start     Dose/Rate Route Frequency Ordered Stop   08/29/19 1000  remdesivir 100 mg in sodium chloride 0.9 % 100 mL IVPB     100 mg 200 mL/hr over 30 Minutes Intravenous Daily 08/28/19 0042 09/01/19 1100   08/28/19 0300  remdesivir 200 mg in sodium chloride 0.9% 250 mL IVPB     200 mg 580 mL/hr over 30 Minutes Intravenous Once 08/28/19 0042 08/28/19 0413  Subjective:   Patient states that he is feeling well.  He says he had a good day yesterday.  No chest pain.  No nausea vomiting.  Continues to be compliant with his incentive spirometer.  Objective:    Vitals:   09/10/19 0414 09/10/19 0751 09/10/19 0807 09/10/19 0827  BP:  101/63    Pulse:  100    Resp:  (!) 30 (!) 23 20  Temp:  98 F (36.7 C)    TempSrc:  Oral    SpO2:  94% 95% 90%  Weight: 122 kg     Height:       SpO2: 90 % O2 Flow Rate (L/min): 15 L/min FiO2 (%): 100 %   Intake/Output Summary (Last 24 hours) at 09/10/2019 1121 Last data filed at 09/10/2019 0500 Gross per 24  hour  Intake 240 ml  Output 1900 ml  Net -1660 ml   Filed Weights   09/09/19 1300 09/10/19 0410 09/10/19 0414  Weight: 124 kg 121.6 kg 122 kg    General appearance: Awake alert.  In no distress Resp: Tachypneic.  No use of accessory muscles.  Few crackles at the bases.  Improved air entry compared to the last few days. Cardio: S1-S2 is mildly tachycardic regular.  No S3-S4.  No rubs or bruit. GI: Abdomen is soft.  Nontender nondistended.  Bowel sounds are present normal.  No masses organomegaly Extremities: No edema.  Full range of motion of lower extremities. Neurologic: Alert and oriented x3.  No focal neurological deficits.     Data Reviewed:    Labs: Basic Metabolic Panel: Recent Labs  Lab 09/06/19 0109 09/07/19 0436 09/08/19 0220 09/09/19 0320 09/10/19 0250  NA 135 136 136 142 137  K 3.7 3.7 3.5 3.8 3.7  CL 95* 97* 97* 96* 96*  CO2 25 26 26 27 27   GLUCOSE 127* 174* 153* 148* 158*  BUN 14 14 15 17 19   CREATININE 0.71 0.80 0.79 0.88 0.91  CALCIUM 8.3* 8.4* 8.6* 8.8* 9.3   GFR Estimated Creatinine Clearance: 131.4 mL/min (by C-G formula based on SCr of 0.91 mg/dL). Liver Function Tests: Recent Labs  Lab 09/05/19 0012 09/06/19 0109 09/07/19 0436 09/08/19 0220 09/09/19 0320  AST 45* 36 32 26 30  ALT 44 43 42 44 58*  ALKPHOS 62 76 76 71 74  BILITOT 0.7 1.3* 1.1 0.9 1.0  PROT 5.3* 6.1* 6.0* 6.0* 6.1*  ALBUMIN 3.0* 3.4* 3.4* 3.5 3.7   COVID-19 Labs  Recent Labs    09/08/19 0220 09/09/19 0320  DDIMER 2.71* 2.71*    Lab Results  Component Value Date   SARSCOV2NAA Detected (A) 08/26/2019    CBC: Recent Labs  Lab 09/05/19 0012 09/06/19 0109 09/07/19 0436 09/08/19 0220 09/09/19 0320  WBC 6.6 10.8* 8.8 9.8 11.1*  HGB 15.7 16.8 16.0 15.9 15.9  HCT 46.6 49.7 47.9 47.6 48.0  MCV 87.1 88.0 88.1 88.1 89.2  PLT 240 283 248 225 237   CBG: Recent Labs  Lab 09/09/19 0730 09/09/19 1117 09/09/19 1616 09/09/19 2117 09/10/19 0750  GLUCAP 167*  364* 299* 225* 183*   D-Dimer: Recent Labs    09/08/19 0220 09/09/19 0320  DDIMER 2.71* 2.71*   Sepsis Labs: Recent Labs  Lab 09/05/19 0012 09/06/19 0109 09/07/19 0436 09/08/19 0220 09/09/19 0320  PROCALCITON <0.10 <0.10  --   --   --   WBC 6.6 10.8* 8.8 9.8 11.1*   Microbiology No results found for this or any previous visit (from the past  240 hour(s)).   Medications:   . vitamin C  500 mg Oral Daily  . dexamethasone (DECADRON) injection  6 mg Intravenous Q24H  . enoxaparin (LOVENOX) injection  65 mg Subcutaneous Q12H  . fluticasone  2 spray Each Nare Daily  . insulin aspart  0-20 Units Subcutaneous TID WC  . insulin aspart  0-5 Units Subcutaneous QHS  . insulin glargine  8 Units Subcutaneous Daily  . Ipratropium-Albuterol  2 puff Inhalation QID  . mometasone-formoterol  2 puff Inhalation BID  . pantoprazole  40 mg Oral BID  . potassium chloride  20 mEq Oral Daily  . zinc sulfate  220 mg Oral Daily   Continuous Infusions:     LOS: 14 days   [employer]  [facility]  09/10/2019, 11:21 AM

## 2019-09-10 NOTE — Plan of Care (Signed)
  Problem: Fluid Volume: Goal: Ability to maintain a balanced intake and output will improve Outcome: Progressing   Problem: Nutritional: Goal: Maintenance of adequate nutrition will improve Outcome: Progressing Goal: Progress toward achieving an optimal weight will improve Outcome: Progressing   Problem: Skin Integrity: Goal: Risk for impaired skin integrity will decrease Outcome: Progressing   Problem: Tissue Perfusion: Goal: Adequacy of tissue perfusion will improve Outcome: Progressing Note: Weaned down to 8L HFNC   Problem: Education: Goal: Knowledge of General Education information will improve Description: Including pain rating scale, medication(s)/side effects and non-pharmacologic comfort measures Outcome: Progressing   Problem: Activity: Goal: Risk for activity intolerance will decrease Outcome: Progressing

## 2019-09-10 NOTE — Progress Notes (Signed)
Patient's wife was called with updates.All questions answered.

## 2019-09-11 ENCOUNTER — Inpatient Hospital Stay (HOSPITAL_COMMUNITY): Payer: Commercial Managed Care - PPO

## 2019-09-11 DIAGNOSIS — R7989 Other specified abnormal findings of blood chemistry: Secondary | ICD-10-CM

## 2019-09-11 LAB — BASIC METABOLIC PANEL
Anion gap: 14 (ref 5–15)
BUN: 20 mg/dL (ref 6–20)
CO2: 25 mmol/L (ref 22–32)
Calcium: 9.3 mg/dL (ref 8.9–10.3)
Chloride: 99 mmol/L (ref 98–111)
Creatinine, Ser: 0.87 mg/dL (ref 0.61–1.24)
GFR calc Af Amer: >60 mL/min (ref >60)
GFR calc non Af Amer: >60 mL/min (ref >60)
Glucose, Bld: 191 mg/dL — ABNORMAL HIGH (ref 70–99)
Potassium: 4.6 mmol/L (ref 3.5–5.1)
Sodium: 138 mmol/L (ref 135–145)

## 2019-09-11 LAB — D-DIMER, QUANTITATIVE: D-Dimer, Quant: 2.2 ug/mL-FEU — ABNORMAL HIGH (ref 0.00–0.50)

## 2019-09-11 LAB — CBC
HCT: 49.1 % (ref 39.0–52.0)
Hemoglobin: 16.7 g/dL (ref 13.0–17.0)
MCH: 29.5 pg (ref 26.0–34.0)
MCHC: 34 g/dL (ref 30.0–36.0)
MCV: 86.7 fL (ref 80.0–100.0)
Platelets: 204 10*3/uL (ref 150–400)
RBC: 5.66 MIL/uL (ref 4.22–5.81)
RDW: 13.9 % (ref 11.5–15.5)
WBC: 9.7 10*3/uL (ref 4.0–10.5)
nRBC: 0 % (ref 0.0–0.2)

## 2019-09-11 LAB — TSH: TSH: 2.491 u[IU]/mL (ref 0.350–4.500)

## 2019-09-11 LAB — GLUCOSE, CAPILLARY
Glucose-Capillary: 183 mg/dL — ABNORMAL HIGH (ref 70–99)
Glucose-Capillary: 337 mg/dL — ABNORMAL HIGH (ref 70–99)
Glucose-Capillary: 256 mg/dL — ABNORMAL HIGH (ref 70–99)
Glucose-Capillary: 224 mg/dL — ABNORMAL HIGH (ref 70–99)

## 2019-09-11 LAB — ECHOCARDIOGRAM COMPLETE
Height: 70 in
Weight: 4268.8 oz

## 2019-09-11 LAB — BRAIN NATRIURETIC PEPTIDE: B Natriuretic Peptide: 156.8 pg/mL — ABNORMAL HIGH (ref 0.0–100.0)

## 2019-09-11 MED ORDER — INSULIN GLARGINE 100 UNIT/ML ~~LOC~~ SOLN: 15.0000 [IU] | Freq: Every day | SUBCUTANEOUS | Status: DC

## 2019-09-11 MED ORDER — LINAGLIPTIN 5 MG PO TABS
5.0000 mg | ORAL_TABLET | Freq: Every day | ORAL | Status: DC
  Administered 2019-09-11 – 2019-09-17 (×7): 5 mg via ORAL
  Filled 2019-09-11 (×7): qty 1

## 2019-09-11 MED ORDER — FUROSEMIDE 10 MG/ML IJ SOLN
40.0000 mg | Freq: Four times a day (QID) | INTRAMUSCULAR | Status: DC
  Administered 2019-09-11: 12:00:00 40 mg via INTRAVENOUS
  Filled 2019-09-11: qty 4

## 2019-09-11 MED ORDER — INSULIN ASPART 100 UNIT/ML ~~LOC~~ SOLN
4.0000 [IU] | Freq: Three times a day (TID) | SUBCUTANEOUS | Status: DC
  Administered 2019-09-12 – 2019-09-17 (×18): 4 [IU] via SUBCUTANEOUS

## 2019-09-11 MED ORDER — METOPROLOL TARTRATE 25 MG PO TABS
12.5000 mg | ORAL_TABLET | Freq: Two times a day (BID) | ORAL | Status: DC
  Administered 2019-09-11: 20:00:00 12.5 mg via ORAL
  Filled 2019-09-11: qty 1

## 2019-09-11 MED ORDER — INSULIN GLARGINE 100 UNIT/ML ~~LOC~~ SOLN
8.0000 [IU] | Freq: Every day | SUBCUTANEOUS | Status: DC
  Administered 2019-09-12 – 2019-09-17 (×7): 8 [IU] via SUBCUTANEOUS
  Filled 2019-09-11 (×6): qty 0.08

## 2019-09-11 NOTE — Progress Notes (Signed)
Rapid Response Event Note  Overview: RN called to room for sustained tachycardia >130  Initial Focused Assessment: Patient in chair, in no distress HR 130s   Interventions: MD paged, EKG performed, patient transferred back to bed.   Plan of Care (if not transferred):  Event Summary: MD to room to further evaluate need of medication administration     Henry Miller

## 2019-09-11 NOTE — Progress Notes (Addendum)
Patient with tachycardia HR 120-130s sustained  Dr Lowell Guitar made aware. Patient is asymptomatic.ICU charge RN came to assess the patient EKG obtained and Dr Lacretia Nicks came to assess the patient and orders received. Instructed to hold Lopressor for now.

## 2019-09-11 NOTE — Progress Notes (Addendum)
Pt has been tolerating 6-8L HFNC awhile asleep thus far this shift with no major desaturations noted, no acute resp distress. Will continue to attempt to wean as tolerated.

## 2019-09-11 NOTE — Progress Notes (Signed)
Inpatient Diabetes Program Recommendations  AACE/ADA: New Consensus Statement on Inpatient Glycemic Control (2015)  Target Ranges:  Prepandial:   less than 140 mg/dL      Peak postprandial:   less than 180 mg/dL (1-2 hours)      Critically ill patients:  140 - 180 mg/dL   Lab Results  Component Value Date   GLUCAP 183 (H) 09/11/2019   HGBA1C 9.9 (H) 08/29/2019    Review of Glycemic Control Results for Henry Miller, Henry Miller (MRN [id number]) as of 09/11/2019 11:59  Ref. Range 09/10/2019 11:28 09/10/2019 16:34 09/10/2019 20:33 09/11/2019 08:14  Glucose-Capillary Latest Ref Range: 70 - 99 mg/dL 840 (H) 375 (H) 436 (H) 183 (H)  Diabetes history: DM 2 Outpatient Diabetes medications: None Current orders for Inpatient glycemic control:  Lantus 8 units daily Novolog 0-20 units tid with meals and HS Decadron 6 mg daily  Inpatient Diabetes Program Recommendations:    Please consider increasing Lantus to 15 units daily. Also please add Novolog meal coverage 4 units tid with meals. Consider adding Tradjenta 5 mg daily.   Thanks,  Beryl Meager, RN, BC-ADM Inpatient Diabetes Coordinator Pager [phone] (8a-5p)

## 2019-09-11 NOTE — Progress Notes (Signed)
  Echocardiogram 2D Echocardiogram has been performed.  Celene Skeen 09/11/2019, 4:43 PM

## 2019-09-11 NOTE — Progress Notes (Signed)
PHYSICAL THERAPY PROGRESS NOTE  CLINICAL IMPRESSION: Pt encouraged by needing significantly less 02 today than previous day but had very difficult time with maintaining saturations and also monitoring sats. Pt able to ambulate in room approx 59ft x 3 with seated rest between each attempt. Pt was on 6L/min via HFNC this day, with ambulation increased to 8L/min HFNC. For most part did well, did desat to 70s but was able to recover with pursed lip breathing and flutter valve use. On last ambulation pt desat to 60s and had difficult time with recovery. Pt was not symptomatic and insistent that he felt fine but even with changing probe from earlobe to finger and back still was reading in low 70s, pt insisted he just needed time to "woosah". While attempting to change probe to forehead probe pt managed to get sats to 88% At which point nurse arrived to room to assess pt.    09/11/19 1139  PT Visit Information  Last PT Received On 09/11/19  Assistance Needed +1  History of Present Illness 49 y.o. male admitted on 08/27/19 for acute respiratory failure with hypoxia due to COVID 19 viral PNA, hypokalemia.  Pt with significant PMH of DM2, morbid obesity.   Subjective Data  Patient Stated Goal slightly emotional from coworkers doing tribute to his 28 years working for [employer].  Precautions  Precautions Other (comment) (monitor 02 and saturations)  Restrictions  Weight Bearing Restrictions No  Pain Assessment  Pain Assessment Faces  Faces Pain Scale 4  Pain Location w/ increased work of breathing  Pain Descriptors / Indicators Discomfort  Pain Intervention(s) Limited activity within patient's tolerance;Monitored during session  Cognition  Arousal/Alertness Awake/alert  Behavior During Therapy WFL for tasks assessed/performed  Overall Cognitive Status Within Functional Limits for tasks assessed  Bed Mobility  General bed mobility comments pt found sitting in recliner  Transfers  Overall transfer  level Modified independent  Equipment used None  General transfer comment gets from bed to recliner to bed side commode on his own  Ambulation/Gait  Ambulation/Gait assistance Supervision  Gait Distance (Feet) 80 Feet  Assistive device None  Gait Pattern/deviations Step-through pattern;Decreased stride length  General Gait Details supervision for lines and to monitor SpO2, was able to ambulate w/ just HFNC at 6-8L/min this am  Gait velocity decr  Balance  Overall balance assessment No apparent balance deficits (not formally assessed)  General Comments  General comments (skin integrity, edema, etc.) Pt only using 6-8L/min via HFNC (NRB on stand by but pt wants to try and regulate 02 without it), did well with ambulation each time desat to 70s but was able to recover within 39mins back to high 80s on 8L/min. on last attempt ambulated 39ft with SBA and line management help but desat to 60s and was not able to recover with just pursed lip breathing and use of flutter valve. Pt did not appear to be symptomatic and was able to carry on coversation as previous without distress but had very difficult time getting a reading from probes, moved probe from ear to finger and back again, even attempted use of [employer] forehead monitor but by this time pt was reading 88%  Exercises  Exercises Other exercises  Other Exercises  Other Exercises incentive spirometer use  Other Exercises flutter valve use  PT - End of Session  Equipment Utilized During Treatment Oxygen  Activity Tolerance Treatment limited secondary to medical complications (Comment)  Patient left in chair;with call bell/phone within reach;with nursing/sitter in room  PT - Assessment/Plan  PT Plan Current plan remains appropriate (updated 1/13)  PT Visit Diagnosis Difficulty in walking, not elsewhere classified (R26.2)  PT Frequency (ACUTE ONLY) Min 3X/week  Follow Up Recommendations Supervision - Intermittent  PT equipment Other  (comment) (home 02)  AM-PAC PT "6 Clicks" Mobility Outcome Measure (Version 2)  Help needed turning from your back to your side while in a flat bed without using bedrails? 4  Help needed moving from lying on your back to sitting on the side of a flat bed without using bedrails? 4  Help needed moving to and from a bed to a chair (including a wheelchair)? 4  Help needed standing up from a chair using your arms (e.g., wheelchair or bedside chair)? 4  Help needed to walk in hospital room? 3  Help needed climbing 3-5 steps with a railing?  3  6 Click Score 22  Consider Recommendation of Discharge To: Home with no services  PT Goal Progression  Progress towards PT goals Progressing toward goals  Acute Rehab PT Goals  PT Goal Formulation With patient  Time For Goal Achievement 09/16/19  Potential to Achieve Goals Fair  PT Time Calculation  PT Start Time (ACUTE ONLY) 1049  PT Stop Time (ACUTE ONLY) 1139  PT Time Calculation (min) (ACUTE ONLY) 50 min  PT General Charges  $$ ACUTE PT VISIT 1 Visit  PT Treatments  $Gait Training 23-37 mins  $Self Care/Home Management 8-22   Drema Pry, PT

## 2019-09-11 NOTE — Progress Notes (Addendum)
PROGRESS NOTE    MARCELIS WISSNER  ZYY:[id number] DOB: 1971/04/08 DOA: 08/27/2019 PCP: Patient, No Pcp Per   Brief Narrative:  YOUSOF ALDERMAN is an 49 y.o. male past medical history of prediabetes, DVT PE presents with a 1 day history of worsening shortness of breath he relates he tested positive for SARS-CoV-2 on 08/26/2019.  Prior to this he had been having mild symptoms of nonproductive cough and a low-grade fever accompanied by nausea vomiting diarrhea.  Checked his saturations at home and was 86% on room air, EMS placed him in 6 L and improved to 89 found tachycardic in the ED and satting 89% on 6 L we will start on remdesivir and steroids oxygen supplementation was increased to 15 L and saturations improved.  Actemra has been given  Assessment & Plan:   Principal Problem:   Pneumonia due to COVID-19 virus Active Problems:   Type 2 diabetes mellitus (HCC)   Morbid obesity with BMI of 40.0-44.9, adult (HCC)   Hypokalemia   Acute respiratory failure with hypoxia (HCC)   Uncontrolled diabetes mellitus with hyperglycemia, without long-term current use of insulin (HCC)  Acute Hypoxic Respiratory Failure 2/2 COVID 19 Pneumonia This morning on 6 L [place], then increased to 10 L with NRB CXR with worsening of bilateral heterogenous and interstitial airspace opacity S/p remdesivir Steroids being tapered S/p actemra x 2 Afebrile without WBC count, suspect persistent hypoxia related to COVID He's had CTA PE protocol x 2 which have been negative with LE [place] x1 negative I/O, daily weights - lasix as tolerated to maintain net negative to euvolemic Prone as able, IS, OOB Continue bid lovenox with severe illness  COVID-19 Labs  Recent Labs    09/09/19 0320 09/11/19 1405  DDIMER 2.71* 2.20*    Lab Results  Component Value Date   SARSCOV2NAA Detected (A) 08/26/2019   Sinus Tachycardia: suspect related to COVID 19 infection and hypoxia/SOB.  As above negative VTE studies.   Echo today with  normal EF, diastolic dysfunction (see report) Low dose metop with holding parameters (BP soft recently)  Diabetes mellitus type 2, uncontrolled with hyperglycemia  HbA1c 9.9.  Patient was on Lantus and SSI.  However he developed a few episodes of hypoglycemia so his Lantus was discontinued.  Subsequently CBGs started climbing again.  Lantus was reintroduced.  Continue current dose for now as the plan is to start tapering his steroids with which we anticipate his glucose levels to start dropping. Will start mealtime and follow with tradjenta as well.   Morbid obesity Estimated body mass index is 38.6 kg/m as calculated from the following:   Height as of this encounter: 5\' 10"  (1.778 m).   Weight as of this encounter: 122 kg.  Hypokalemia: Repleted.  Monitor while he is getting Lasix.  Called to bedside for persistent tachycardia, desaturation.  Pt noted he was comfortable, no new complaints.  Appreciate his nurse who brought attention to his HR which was more persistently elevated today than yesterday.  Suspect related to COVID.  BP on soft side with SBP in 90's, hold second dose of lasix.  Holding parameters for metoprolol.  Echo ordered.  EKG showed sinus tach.  Continue to monitor, discussed with pt and nurse.  DVT prophylaxis: lovenox Code Status: full  Family Communication: none at bedside - called wife Disposition Plan: none at bedside   Consultants:   none  Procedures:  Echo IMPRESSIONS    1. Left ventricular ejection fraction, by visual estimation, is 60  to 65%. The left ventricle has normal function. There is no left ventricular hypertrophy.  2. Left ventricular diastolic parameters are consistent with Grade I diastolic dysfunction (impaired relaxation).  3. The left ventricle has no regional wall motion abnormalities.  4. Global right ventricle was not well visualized.The right ventricular size is not well visualized. Right vetricular wall thickness was not assessed.   5. Left atrial size was normal.  6. Right atrial size was normal.  7. The mitral valve is normal in structure. No evidence of mitral valve regurgitation. No evidence of mitral stenosis.  8. The tricuspid valve is normal in structure.  9. The aortic valve was not well visualized. Aortic valve regurgitation is not visualized. No evidence of aortic valve sclerosis or stenosis. 10. TR signal is inadequate for assessing pulmonary artery systolic pressure. 11. The inferior vena cava is normal in size with greater than 50% respiratory variability, suggesting right atrial pressure of 3 mmHg. 12. Technically difficult study with poor acoustic windows.  LE [place] Summary: Right: There is no evidence of deep vein thrombosis in the lower extremity. No cystic structure found in the popliteal fossa. Left: There is no evidence of deep vein thrombosis in the lower extremity. No cystic structure found in the popliteal fossa.   Antimicrobials:  Anti-infectives (From admission, onward)   Start     Dose/Rate Route Frequency Ordered Stop   08/29/19 1000  remdesivir 100 mg in sodium chloride 0.9 % 100 mL IVPB     100 mg 200 mL/hr over 30 Minutes Intravenous Daily 08/28/19 0042 09/01/19 1100   08/28/19 0300  remdesivir 200 mg in sodium chloride 0.9% 250 mL IVPB     200 mg 580 mL/hr over 30 Minutes Intravenous Once 08/28/19 0042 08/28/19 0413     Subjective: Feeling about the same No CP or palpitations  Objective: Vitals:   09/11/19 1345 09/11/19 1501 09/11/19 1600 09/11/19 1747  BP:  108/79  104/76  Pulse:  (!) 101  (!) 119  Resp: 16 19 (!) 24 (!) 25  Temp:  98 F (36.7 C) (!) 97.2 F (36.2 C) 97.8 F (36.6 C)  TempSrc:  Oral Axillary Axillary  SpO2:    94%  Weight:      Height:        Intake/Output Summary (Last 24 hours) at 09/11/2019 1856 Last data filed at 09/11/2019 1800 Gross per 24 hour  Intake 620 ml  Output 1475 ml  Net -855 ml   Filed Weights   09/10/19 0410 09/10/19 0414 09/11/19  0500  Weight: 121.6 kg 122 kg 121 kg    Examination:  General exam: Appears calm and comfortable  Respiratory system: slightly increased WOB Cardiovascular system: tachycardic Gastrointestinal system: Abdomen is nondistended, soft and nontender. Central nervous system: Alert and oriented. No focal neurological deficits. Extremities: no LEE Skin: No rashes, lesions or ulcers Psychiatry: Judgement and insight appear normal. Mood & affect appropriate.     Data Reviewed: I have personally reviewed following labs and imaging studies  CBC: Recent Labs  Lab 09/06/19 0109 09/07/19 0436 09/08/19 0220 09/09/19 0320 09/11/19 1405  WBC 10.8* 8.8 9.8 11.1* 9.7  HGB 16.8 16.0 15.9 15.9 16.7  HCT 49.7 47.9 47.6 48.0 49.1  MCV 88.0 88.1 88.1 89.2 86.7  PLT 283 248 225 237 204   Basic Metabolic Panel: Recent Labs  Lab 09/07/19 0436 09/08/19 0220 09/09/19 0320 09/10/19 0250 09/11/19 0417  NA 136 136 142 137 138  K 3.7 3.5 3.8 3.7 4.6  CL 97* 97* 96* 96* 99  CO2 26 26 27 27 25   GLUCOSE 174* 153* 148* 158* 191*  BUN 14 15 17 19 20   CREATININE 0.80 0.79 0.88 0.91 0.87  CALCIUM 8.4* 8.6* 8.8* 9.3 9.3   GFR: Estimated Creatinine Clearance: 136.9 mL/min (by C-G formula based on SCr of 0.87 mg/dL). Liver Function Tests: Recent Labs  Lab 09/05/19 0012 09/06/19 0109 09/07/19 0436 09/08/19 0220 09/09/19 0320  AST 45* 36 32 26 30  ALT 44 43 42 44 58*  ALKPHOS 62 76 76 71 74  BILITOT 0.7 1.3* 1.1 0.9 1.0  PROT 5.3* 6.1* 6.0* 6.0* 6.1*  ALBUMIN 3.0* 3.4* 3.4* 3.5 3.7   No results for input(s): LIPASE, AMYLASE in the last 168 hours. No results for input(s): AMMONIA in the last 168 hours. Coagulation Profile: No results for input(s): INR, PROTIME in the last 168 hours. Cardiac Enzymes: No results for input(s): CKTOTAL, CKMB, CKMBINDEX, TROPONINI in the last 168 hours. BNP (last 3 results) No results for input(s): PROBNP in the last 8760 hours. HbA1C: No results for  input(s): HGBA1C in the last 72 hours. CBG: Recent Labs  Lab 09/10/19 1128 09/10/19 1634 09/10/19 2033 09/11/19 0814 09/11/19 1616  GLUCAP 300* 334* 309* 183* 256*   Lipid Profile: No results for input(s): CHOL, HDL, LDLCALC, TRIG, CHOLHDL, LDLDIRECT in the last 72 hours. Thyroid Function Tests: Recent Labs    09/11/19 0417  TSH 2.491   Anemia Panel: No results for input(s): VITAMINB12, FOLATE, FERRITIN, TIBC, IRON, RETICCTPCT in the last 72 hours. Sepsis Labs: Recent Labs  Lab 09/05/19 0012 09/06/19 0109  PROCALCITON <0.10 <0.10    No results found for this or any previous visit (from the past 240 hour(s)).       Radiology Studies: PCXR COVID AM  Result Date: 09/11/2019 CLINICAL DATA:  COVID-19 pneumonia EXAM: PORTABLE CHEST 1 VIEW COMPARISON:  09/08/2019 FINDINGS: The heart size and mediastinal contours are within normal limits. Slight interval worsening of diffuse bilateral heterogeneous and interstitial airspace opacity, particularly in the right upper lung. The visualized skeletal structures are unremarkable. IMPRESSION: Slight interval worsening of diffuse bilateral heterogeneous and interstitial airspace opacity, particularly in the right upper lung, consistent with worsened infection or edema. Electronically Signed   By: Eddie Candle M.D.   On: 09/11/2019 09:10   ECHOCARDIOGRAM COMPLETE  Result Date: 09/11/2019   ECHOCARDIOGRAM REPORT   Patient Name:   KIYOSHI SCHAAB Date of Exam: 09/11/2019 Medical Rec #:  [id number]      Height:       70.0 in Accession #:    [id number]     Weight:       266.8 lb Date of Birth:  1970/12/17      BSA:          2.36 m Patient Age:    68 years       BP:           108/79 mmHg Patient Gender: M              HR:           110 bpm. Exam Location:  Inpatient Procedure: 2D Echo Indications:    Elevated brain natriuretic peptide (BNP) level  History:        Patient has no prior history of Echocardiogram examinations.                 Risk  Factors:Former Smoker and Diabetes.  Sonographer:    Doyne Keel  Shankar RDCS (AE) Referring Phys: [phone] A CALDWELL POWELL JR  Sonographer Comments: No parasternal window. Image acquisition challenging due to patient body habitus. IMPRESSIONS  1. Left ventricular ejection fraction, by visual estimation, is 60 to 65%. The left ventricle has normal function. There is no left ventricular hypertrophy.  2. Left ventricular diastolic parameters are consistent with Grade I diastolic dysfunction (impaired relaxation).  3. The left ventricle has no regional wall motion abnormalities.  4. Global right ventricle was not well visualized.The right ventricular size is not well visualized. Right vetricular wall thickness was not assessed.  5. Left atrial size was normal.  6. Right atrial size was normal.  7. The mitral valve is normal in structure. No evidence of mitral valve regurgitation. No evidence of mitral stenosis.  8. The tricuspid valve is normal in structure.  9. The aortic valve was not well visualized. Aortic valve regurgitation is not visualized. No evidence of aortic valve sclerosis or stenosis. 10. TR signal is inadequate for assessing pulmonary artery systolic pressure. 11. The inferior vena cava is normal in size with greater than 50% respiratory variability, suggesting right atrial pressure of 3 mmHg. 12. Technically difficult study with poor acoustic windows. FINDINGS  Left Ventricle: Left ventricular ejection fraction, by visual estimation, is 60 to 65%. The left ventricle has normal function. The left ventricle has no regional wall motion abnormalities. The left ventricular internal cavity size was the left ventricle is normal in size. There is no left ventricular hypertrophy. Left ventricular diastolic parameters are consistent with Grade I diastolic dysfunction (impaired relaxation). Right Ventricle: The right ventricular size is not well visualized. Right vetricular wall thickness was not assessed. Global RV  systolic function is was not well visualized. Left Atrium: Left atrial size was normal in size. Right Atrium: Right atrial size was normal in size Pericardium: There is no evidence of pericardial effusion. Mitral Valve: The mitral valve is normal in structure. No evidence of mitral valve regurgitation. No evidence of mitral valve stenosis by observation. Tricuspid Valve: The tricuspid valve is normal in structure. Tricuspid valve regurgitation is not demonstrated. Aortic Valve: The aortic valve was not well visualized. Aortic valve regurgitation is not visualized. The aortic valve is structurally normal, with no evidence of sclerosis or stenosis. Pulmonic Valve: The pulmonic valve was not well visualized. Pulmonic valve regurgitation is not visualized. Pulmonic regurgitation is not visualized. Aorta: The aortic root is normal in size and structure. Venous: The inferior vena cava is normal in size with greater than 50% respiratory variability, suggesting right atrial pressure of 3 mmHg. IAS/Shunts: No atrial level shunt detected by color flow Doppler.  LEFT ATRIUM             Index LA Vol (A2C):   36.3 ml 15.39 ml/m LA Vol (A4C):   34.4 ml 14.58 ml/m LA Biplane Vol: 36.6 ml 15.51 ml/m  AORTIC VALVE LVOT Vmax:   63.50 cm/s LVOT Vmean:  47.400 cm/s LVOT VTI:    0.071 m  SHUNTS Systemic VTI: 0.07 m  Marca Anconaalton Mclean MD Electronically signed by Marca Anconaalton Mclean MD Signature Date/Time: 09/11/2019/5:18:27 PM    Final         Scheduled Meds: . vitamin C  500 mg Oral Daily  . dexamethasone (DECADRON) injection  4 mg Intravenous Q24H  . enoxaparin (LOVENOX) injection  65 mg Subcutaneous Q12H  . fluticasone  2 spray Each Nare Daily  . insulin aspart  0-20 Units Subcutaneous TID WC  . insulin aspart  0-5 Units  Subcutaneous QHS  . insulin glargine  8 Units Subcutaneous Daily  . Ipratropium-Albuterol  2 puff Inhalation QID  . metoprolol tartrate  12.5 mg Oral BID  . mometasone-formoterol  2 puff Inhalation BID  .  pantoprazole  40 mg Oral BID  . potassium chloride  20 mEq Oral Daily  . zinc sulfate  220 mg Oral Daily   Continuous Infusions:   LOS: 15 days    Time spent: over 30 min    Lacretia Nicks, MD [facility] Pager AMION  If 7PM-7AM, please contact night-coverage [email or web address] Password [facility] 09/11/2019, 6:56 PM

## 2019-09-11 NOTE — Progress Notes (Signed)
Pt up to bedside commode, edge of bed this am- required titration up to 15L HFNC for 02 sats in 70's/tachypnea/dyspnea lasting a few mintues. Will continue to wean oxygen when appropriate and as tolerated.

## 2019-09-12 LAB — COMPREHENSIVE METABOLIC PANEL
ALT: 74 U/L — ABNORMAL HIGH (ref 0–44)
AST: 28 U/L (ref 15–41)
Albumin: 3.6 g/dL (ref 3.5–5.0)
Alkaline Phosphatase: 64 U/L (ref 38–126)
Anion gap: 11 (ref 5–15)
BUN: 20 mg/dL (ref 6–20)
CO2: 28 mmol/L (ref 22–32)
Calcium: 8.9 mg/dL (ref 8.9–10.3)
Chloride: 98 mmol/L (ref 98–111)
Creatinine, Ser: 0.96 mg/dL (ref 0.61–1.24)
GFR calc Af Amer: >60 mL/min (ref >60)
GFR calc non Af Amer: >60 mL/min (ref >60)
Glucose, Bld: 154 mg/dL — ABNORMAL HIGH (ref 70–99)
Potassium: 3.6 mmol/L (ref 3.5–5.1)
Sodium: 137 mmol/L (ref 135–145)
Total Bilirubin: 1.1 mg/dL (ref 0.3–1.2)
Total Protein: 6 g/dL — ABNORMAL LOW (ref 6.5–8.1)

## 2019-09-12 LAB — CBC WITH DIFFERENTIAL/PLATELET
Abs Immature Granulocytes: 0.16 10*3/uL — ABNORMAL HIGH (ref 0.00–0.07)
Basophils Absolute: 0.1 10*3/uL (ref 0.0–0.1)
Basophils Relative: 1 %
Eosinophils Absolute: 0.3 10*3/uL (ref 0.0–0.5)
Eosinophils Relative: 4 %
HCT: 46.5 % (ref 39.0–52.0)
Hemoglobin: 15.9 g/dL (ref 13.0–17.0)
Immature Granulocytes: 2 %
Lymphocytes Relative: 18 %
Lymphs Abs: 1.3 10*3/uL (ref 0.7–4.0)
MCH: 30.3 pg (ref 26.0–34.0)
MCHC: 34.2 g/dL (ref 30.0–36.0)
MCV: 88.7 fL (ref 80.0–100.0)
Monocytes Absolute: 0.6 10*3/uL (ref 0.1–1.0)
Monocytes Relative: 8 %
Neutro Abs: 5 10*3/uL (ref 1.7–7.7)
Neutrophils Relative %: 67 %
Platelets: 176 10*3/uL (ref 150–400)
RBC: 5.24 MIL/uL (ref 4.22–5.81)
RDW: 14.1 % (ref 11.5–15.5)
WBC: 7.3 10*3/uL (ref 4.0–10.5)
nRBC: 0 % (ref 0.0–0.2)

## 2019-09-12 LAB — C-REACTIVE PROTEIN: CRP: 0.6 mg/dL (ref <1.0)

## 2019-09-12 LAB — GLUCOSE, CAPILLARY
Glucose-Capillary: 185 mg/dL — ABNORMAL HIGH (ref 70–99)
Glucose-Capillary: 225 mg/dL — ABNORMAL HIGH (ref 70–99)
Glucose-Capillary: 267 mg/dL — ABNORMAL HIGH (ref 70–99)
Glucose-Capillary: 229 mg/dL — ABNORMAL HIGH (ref 70–99)

## 2019-09-12 LAB — FERRITIN: Ferritin: 479 ng/mL — ABNORMAL HIGH (ref 24–336)

## 2019-09-12 LAB — D-DIMER, QUANTITATIVE: D-Dimer, Quant: 1.66 ug/mL-FEU — ABNORMAL HIGH (ref 0.00–0.50)

## 2019-09-12 LAB — MAGNESIUM: Magnesium: 2.2 mg/dL (ref 1.7–2.4)

## 2019-09-12 LAB — PHOSPHORUS: Phosphorus: 4.9 mg/dL — ABNORMAL HIGH (ref 2.5–4.6)

## 2019-09-12 MED ORDER — DEXAMETHASONE SODIUM PHOSPHATE 4 MG/ML IJ SOLN
2.0000 mg | INTRAMUSCULAR | Status: AC
  Administered 2019-09-13 – 2019-09-14 (×2): 2 mg via INTRAVENOUS
  Filled 2019-09-12 (×2): qty 1

## 2019-09-12 MED ORDER — LIDOCAINE VISCOUS HCL 2 % MT SOLN
15.0000 mL | OROMUCOSAL | Status: DC | PRN
  Administered 2019-09-12 – 2019-09-16 (×7): 15 mL via OROMUCOSAL
  Filled 2019-09-12 (×9): qty 15

## 2019-09-12 MED ORDER — LEVALBUTEROL TARTRATE 45 MCG/ACT IN AERO
2.0000 | INHALATION_SPRAY | Freq: Four times a day (QID) | RESPIRATORY_TRACT | Status: DC
  Administered 2019-09-12 – 2019-09-17 (×19): 2 via RESPIRATORY_TRACT
  Filled 2019-09-12: qty 15

## 2019-09-12 NOTE — Progress Notes (Signed)
Patient reports that he has notified his wife of his status and updated her.

## 2019-09-12 NOTE — Progress Notes (Addendum)
PROGRESS NOTE    Henry Miller  TDH:[id number] DOB: 1971/04/26 DOA: 08/27/2019 PCP: Henry Miller   Brief Narrative:  Henry Miller is an 49 y.o. male past medical history of prediabetes, DVT PE presents with Henry Miller 1 day history of worsening shortness of breath he relates he tested positive for SARS-CoV-2 on 08/26/2019.  Prior to this he had been having mild symptoms of nonproductive cough and Henry Miller low-grade fever accompanied by nausea vomiting diarrhea.  Checked his saturations at home and was 86% on room air, EMS placed him in 6 L and improved to 89 found tachycardic in the ED and satting 89% on 6 L we will start on remdesivir and steroids oxygen supplementation was increased to 15 L and saturations improved.  Actemra has been given  Assessment & Plan:   Principal Problem:   Pneumonia due to COVID-19 virus Active Problems:   Type 2 diabetes mellitus ([place])   Morbid obesity with BMI of 40.0-44.9, adult (HCC)   Hypokalemia   Acute respiratory failure with hypoxia ([place])   Uncontrolled diabetes mellitus with hyperglycemia, without long-term current use of insulin (HCC)  Acute Hypoxic Respiratory Failure 2/2 COVID 19 Pneumonia Currently on 10 L HFNC (occasionally with NRB on top) CXR with worsening of bilateral heterogenous and interstitial airspace opacity S/p remdesivir Steroids being tapered S/p actemra x 2 Afebrile without WBC count, suspect persistent hypoxia related to COVID He's had CTA PE protocol x 2 which have been negative with LE [place] x1 negative I/O, daily weights - lasix as tolerated to maintain net negative to euvolemic (holding with soft pressures) Prone as able, IS, OOB Continue bid lovenox with severe illness  COVID-19 Labs  Recent Labs    09/11/19 1405 09/12/19 0326  DDIMER 2.20* 1.66*  FERRITIN  --  479*  CRP  --  0.6    Lab Results  Component Value Date   SARSCOV2NAA Detected ([name]) 08/26/2019   Sinus Tachycardia: suspect related to COVID 19 infection and  hypoxia/SOB.  As above negative VTE studies.   Echo today with normal EF, diastolic dysfunction (see report) BP soft, no beta blocker for now Change inhalers to xopenex   Diabetes mellitus type 2, uncontrolled with hyperglycemia  HbA1c 9.9.  Patient was on Lantus and SSI.  However he developed Henry Miller few episodes of hypoglycemia so his Lantus was discontinued.  Subsequently CBGs started climbing again.  Lantus was reintroduced.  Continue current dose for now as the plan is to start tapering his steroids with which we anticipate his glucose levels to start dropping. Will start mealtime and follow with tradjenta as well.   Morbid obesity Estimated body mass index is 38.6 kg/m as calculated from the following:   Height as of this encounter: 5\' 10"  (1.778 m).   Weight as of this encounter: 122 kg.  Hypokalemia: Repleted.  Monitor while he is getting Lasix.  DVT prophylaxis: lovenox Code Status: full  Family Communication: none at bedside - called wife 1/14 Disposition Plan: none at bedside   Consultants:   none  Procedures:  Echo IMPRESSIONS    1. Left ventricular ejection fraction, by visual estimation, is 60 to 65%. The left ventricle has normal function. There is no left ventricular hypertrophy.  2. Left ventricular diastolic parameters are consistent with Grade I diastolic dysfunction (impaired relaxation).  3. The left ventricle has no regional wall motion abnormalities.  4. Global right ventricle was not well visualized.The right ventricular size is not well visualized. Right vetricular wall  thickness was not assessed.  5. Left atrial size was normal.  6. Right atrial size was normal.  7. The mitral valve is normal in structure. No evidence of mitral valve regurgitation. No evidence of mitral stenosis.  8. The tricuspid valve is normal in structure.  9. The aortic valve was not well visualized. Aortic valve regurgitation is not visualized. No evidence of aortic valve  sclerosis or stenosis. 10. TR signal is inadequate for assessing pulmonary artery systolic pressure. 11. The inferior vena cava is normal in size with greater than 50% respiratory variability, suggesting right atrial pressure of 3 mmHg. 12. Technically difficult study with poor acoustic windows.  LE US Summary: Right: There is no evidence of deep vein thrombosis in the lower extremity. No cystic structure found in the popliteal fossa. Left: There is no evidence of deep vein thrombosis in the lower extremity. No cystic structure found in the popliteal fossa.   Antimicrobials:  Anti-infectives (From admission, onward)   Start     Dose/Rate Route Frequency Ordered Stop   08/29/19 1000  remdesivir 100 mg in sodium chloride 0.9 % 100 mL IVPB     100 mg 200 mL/hr over 30 Minutes Intravenous Daily 08/28/19 0042 09/01/19 1100   08/28/19 0300  remdesivir 200 mg in sodium chloride 0.9% 250 mL IVPB     200 mg 580 mL/hr over 30 Minutes Intravenous Once 08/28/19 0042 08/28/19 0413     Subjective: No new complaints  Objective: Vitals:   09/12/19 0743 09/12/19 1200 09/12/19 1430 09/12/19 1645  BP: 97/72 101/79  96/74  Pulse: (!) 105 (!) 116  (!) 116  Resp: 18 (!) 22  20  Temp: 98.2 F (36.8 C) 98.3 F (36.8 C)  98.5 F (36.9 C)  TempSrc: Oral Oral  Oral  SpO2: 90% 100% 92% 91%  Weight:      Height:        Intake/Output Summary (Last 24 hours) at 09/12/2019 1757 Last data filed at 09/12/2019 1400 Gross Miller 24 hour  Intake 367 ml  Output 650 ml  Net -283 ml   Filed Weights   09/10/19 0410 09/10/19 0414 09/11/19 0500  Weight: 121.6 kg 122 kg 121 kg    Examination:  General: No acute distress. Cardiovascular: Heart sounds show Henry Miller regular rate, and rhythm.  Lungs: Clear to auscultation bilaterally, on HFNC and NRB, tachypnia Abdomen: Soft, nontender, nondistended  Neurological: Alert and oriented 3. Moves all extremities 4. Cranial nerves II through XII grossly intact. Skin:  Warm and dry. No rashes or lesions. Extremities: No clubbing or cyanosis. No edema.     Data Reviewed: I have personally reviewed following labs and imaging studies  CBC: Recent Labs  Lab 09/07/19 0436 09/08/19 0220 09/09/19 0320 09/11/19 1405 09/12/19 0326  WBC 8.8 9.8 11.1* 9.7 7.3  NEUTROABS  --   --   --   --  5.0  HGB 16.0 15.9 15.9 16.7 15.9  HCT 47.9 47.6 48.0 49.1 46.5  MCV 88.1 88.1 89.2 86.7 88.7  PLT 248 225 237 204 176   Basic Metabolic Panel: Recent Labs  Lab 09/08/19 0220 09/09/19 0320 09/10/19 0250 09/11/19 0417 09/12/19 0326  NA 136 142 137 138 137  K 3.5 3.8 3.7 4.6 3.6  CL 97* 96* 96* 99 98  CO2 26 27 27 25 28   GLUCOSE 153* 148* 158* 191* 154*  BUN 15 17 19 20 20   CREATININE 0.79 0.88 0.91 0.87 0.96  CALCIUM 8.6* 8.8* 9.3 9.3 8.9  MG  --   --   --   --  2.2  PHOS  --   --   --   --  4.9*   GFR: Estimated Creatinine Clearance: 124.1 mL/min (by C-G formula based on SCr of 0.96 mg/dL). Liver Function Tests: Recent Labs  Lab 09/06/19 0109 09/07/19 0436 09/08/19 0220 09/09/19 0320 09/12/19 0326  AST 36 32 26 30 28   ALT 43 42 44 58* 74*  ALKPHOS 76 76 71 74 64  BILITOT 1.3* 1.1 0.9 1.0 1.1  PROT 6.1* 6.0* 6.0* 6.1* 6.0*  ALBUMIN 3.4* 3.4* 3.5 3.7 3.6   No results for input(s): LIPASE, AMYLASE in the last 168 hours. No results for input(s): AMMONIA in the last 168 hours. Coagulation Profile: No results for input(s): INR, PROTIME in the last 168 hours. Cardiac Enzymes: No results for input(s): CKTOTAL, CKMB, CKMBINDEX, TROPONINI in the last 168 hours. BNP (last 3 results) No results for input(s): PROBNP in the last 8760 hours. HbA1C: No results for input(s): HGBA1C in the last 72 hours. CBG: Recent Labs  Lab 09/11/19 1616 09/11/19 2032 09/12/19 0741 09/12/19 1228 09/12/19 1644  GLUCAP 256* 224* 185* 225* 267*   Lipid Profile: No results for input(s): CHOL, HDL, LDLCALC, TRIG, CHOLHDL, LDLDIRECT in the last 72 hours. Thyroid  Function Tests: Recent Labs    09/11/19 0417  TSH 2.491   Anemia Panel: Recent Labs    09/12/19 0326  FERRITIN 479*   Sepsis Labs: Recent Labs  Lab 09/06/19 0109  PROCALCITON <0.10    No results found for this or any previous visit (from the past 240 hour(s)).       Radiology Studies: PCXR COVID AM  Result Date: 09/11/2019 CLINICAL DATA:  COVID-19 pneumonia EXAM: PORTABLE CHEST 1 VIEW COMPARISON:  09/08/2019 FINDINGS: The heart size and mediastinal contours are within normal limits. Slight interval worsening of diffuse bilateral heterogeneous and interstitial airspace opacity, particularly in the right upper lung. The visualized skeletal structures are unremarkable. IMPRESSION: Slight interval worsening of diffuse bilateral heterogeneous and interstitial airspace opacity, particularly in the right upper lung, consistent with worsened infection or edema. Electronically Signed   By: 11/06/2019 M.D.   On: 09/11/2019 09:10   ECHOCARDIOGRAM COMPLETE  Result Date: 09/11/2019   ECHOCARDIOGRAM REPORT   Patient Name:   Henry Miller Date of Exam: 09/11/2019 Medical Rec #:  09/13/2019      Height:       70.0 in Accession #:    [id number]     Weight:       266.8 lb Date of Birth:  [birth year]      BSA:          2.36 m Patient Age:    48 years       BP:           108/79 mmHg Patient Gender: M              HR:           110 bpm. Exam Location:  Inpatient Procedure: 2D Echo Indications:    Elevated brain natriuretic peptide (BNP) level  History:        Patient has no prior history of Echocardiogram examinations.                 Risk Factors:Former Smoker and Diabetes.  Sonographer:    09/15/1971 RDCS (AE) Referring Phys: [phone] Catalia Massett CALDWELL POWELL JR  Sonographer Comments: No parasternal window. Image acquisition challenging  due to patient body habitus. IMPRESSIONS  1. Left ventricular ejection fraction, by visual estimation, is 60 to 65%. The left ventricle has normal function. There is no left  ventricular hypertrophy.  2. Left ventricular diastolic parameters are consistent with Grade I diastolic dysfunction (impaired relaxation).  3. The left ventricle has no regional wall motion abnormalities.  4. Global right ventricle was not well visualized.The right ventricular size is not well visualized. Right vetricular wall thickness was not assessed.  5. Left atrial size was normal.  6. Right atrial size was normal.  7. The mitral valve is normal in structure. No evidence of mitral valve regurgitation. No evidence of mitral stenosis.  8. The tricuspid valve is normal in structure.  9. The aortic valve was not well visualized. Aortic valve regurgitation is not visualized. No evidence of aortic valve sclerosis or stenosis. 10. TR signal is inadequate for assessing pulmonary artery systolic pressure. 11. The inferior vena cava is normal in size with greater than 50% respiratory variability, suggesting right atrial pressure of 3 mmHg. 12. Technically difficult study with poor acoustic windows. FINDINGS  Left Ventricle: Left ventricular ejection fraction, by visual estimation, is 60 to 65%. The left ventricle has normal function. The left ventricle has no regional wall motion abnormalities. The left ventricular internal cavity size was the left ventricle is normal in size. There is no left ventricular hypertrophy. Left ventricular diastolic parameters are consistent with Grade I diastolic dysfunction (impaired relaxation). Right Ventricle: The right ventricular size is not well visualized. Right vetricular wall thickness was not assessed. Global RV systolic function is was not well visualized. Left Atrium: Left atrial size was normal in size. Right Atrium: Right atrial size was normal in size Pericardium: There is no evidence of pericardial effusion. Mitral Valve: The mitral valve is normal in structure. No evidence of mitral valve regurgitation. No evidence of mitral valve stenosis by observation. Tricuspid Valve:  The tricuspid valve is normal in structure. Tricuspid valve regurgitation is not demonstrated. Aortic Valve: The aortic valve was not well visualized. Aortic valve regurgitation is not visualized. The aortic valve is structurally normal, with no evidence of sclerosis or stenosis. Pulmonic Valve: The pulmonic valve was not well visualized. Pulmonic valve regurgitation is not visualized. Pulmonic regurgitation is not visualized. Aorta: The aortic root is normal in size and structure. Venous: The inferior vena cava is normal in size with greater than 50% respiratory variability, suggesting right atrial pressure of 3 mmHg. IAS/Shunts: No atrial level shunt detected by color flow Doppler.  LEFT ATRIUM             Index LA Vol (A2C):   36.3 ml 15.39 ml/m LA Vol (A4C):   34.4 ml 14.58 ml/m LA Biplane Vol: 36.6 ml 15.51 ml/m  AORTIC VALVE LVOT Vmax:   63.50 cm/s LVOT Vmean:  47.400 cm/s LVOT VTI:    0.071 m  SHUNTS Systemic VTI: 0.07 m  Marca Ancona MD Electronically signed by Marca Ancona MD Signature Date/Time: 09/11/2019/5:18:27 PM    Final         Scheduled Meds: . vitamin C  500 mg Oral Daily  . dexamethasone (DECADRON) injection  4 mg Intravenous Q24H  . enoxaparin (LOVENOX) injection  65 mg Subcutaneous Q12H  . fluticasone  2 spray Each Nare Daily  . insulin aspart  0-20 Units Subcutaneous TID WC  . insulin aspart  0-5 Units Subcutaneous QHS  . insulin aspart  4 Units Subcutaneous TID WC  . insulin glargine  8  Units Subcutaneous Daily  . Ipratropium-Albuterol  2 puff Inhalation QID  . linagliptin  5 mg Oral Daily  . mometasone-formoterol  2 puff Inhalation BID  . pantoprazole  40 mg Oral BID  . potassium chloride  20 mEq Oral Daily  . zinc sulfate  220 mg Oral Daily   Continuous Infusions:   LOS: 16 days    Time spent: over 30 min    Lacretia Nicks, MD [facility] Pager AMION  If 7PM-7AM, please contact night-coverage [email or web address] Password [facility] 09/12/2019, 5:57 PM

## 2019-09-12 NOTE — Plan of Care (Signed)
Patient Care Plan  Problem: Education: Goal: Ability to describe self-care measures that may prevent or decrease complications (Diabetes Survival Skills Education) will improve Outcome: Progressing   Problem: Coping: Goal: Ability to adjust to condition or change in health will improve Outcome: Progressing   Problem: Fluid Volume: Goal: Ability to maintain a balanced intake and output will improve Outcome: Progressing   Problem: Health Behavior/Discharge Planning: Goal: Ability to identify and utilize available resources and services will improve Outcome: Progressing Goal: Ability to manage health-related needs will improve Outcome: Progressing   Problem: Metabolic: Goal: Ability to maintain appropriate glucose levels will improve Outcome: Progressing   Problem: Nutritional: Goal: Maintenance of adequate nutrition will improve Outcome: Progressing   Problem: Skin Integrity: Goal: Risk for impaired skin integrity will decrease Outcome: Progressing   Problem: Education: Goal: Knowledge of General Education information will improve Description: Including pain rating scale, medication(s)/side effects and non-pharmacologic comfort measures Outcome: Progressing

## 2019-09-12 NOTE — Progress Notes (Signed)
Morning dosage of Combivent held due to tachycardia. HR is 115. MD made aware and wants Combivent to be held.

## 2019-09-12 NOTE — Progress Notes (Signed)
Occupational Therapy Treatment Patient Details Name: Henry Miller MRN: [id number] DOB: 1970-12-04 Today's Date: 09/12/2019    History of present illness 49 y.o. male admitted on 08/27/19 for acute respiratory failure with hypoxia due to COVID 19 viral PNA, hypokalemia.  Pt with significant PMH of DM2, morbid obesity.    OT comments  Pt remains VERY motivated and agreeable to work with therapy. On 15L NRB and 10L HFNC, On just the HFNC with PLB Pt desturated to 83, with just 15L via NRB he was able to maintain around 90%. Initial HR was about 110, and with standing activity it went up to 135, RR increased DOE3/4 with coughing, SpO2 dropped to 83 but able to come back up to 90 after a few min and PLB. Returned to seated position as HR initially went from 134-120, but was not able to return to 100 in standing position. Pt is eager to do anything that can help him improve. Plan on initiating HEP with theraband next session.    Follow Up Recommendations  No OT follow up;Supervision - Intermittent    Equipment Recommendations  3 in 1 bedside commode;Other (comment)(wide)    Recommendations for Other Services PT consult    Precautions / Restrictions Precautions Precautions: Other (comment) Precaution Comments: watch HR and SpO2; 15L NRB Restrictions Weight Bearing Restrictions: No       Mobility Bed Mobility               General bed mobility comments: in recliner at beginning and end of session  Transfers Overall transfer level: Modified independent Equipment used: None Transfers: Sit to/from Stand Sit to Stand: Supervision              Balance Overall balance assessment: No apparent balance deficits (not formally assessed)                                         ADL either performed or assessed with clinical judgement   ADL Overall ADL's : Needs assistance/impaired     Grooming: Wash/dry hands;Oral care;Wash/dry face;Brushing hair;Set  up;Sitting Grooming Details (indicate cue type and reason): in recliner - cues to breathe through nose                 Toilet Transfer: BSC;Supervision/safety;Ambulation Toilet Transfer Details (indicate cue type and reason): no LOB, strong transfer         Functional mobility during ADLs: Supervision/safety       Vision       Perception     Praxis      Cognition Arousal/Alertness: Awake/alert Behavior During Therapy: WFL for tasks assessed/performed Overall Cognitive Status: Within Functional Limits for tasks assessed                                 General Comments: very motivated        Exercises Exercises: Other exercises Other Exercises Other Exercises: incentive spirometer use Other Exercises: flutter valve use   Shoulder Instructions       General Comments      Pertinent Vitals/ Pain       Pain Assessment: No/denies pain  Home Living  Prior Functioning/Environment              Frequency  Min 3X/week        Progress Toward Goals  OT Goals(current goals can now be found in the care plan section)  Progress towards OT goals: Progressing toward goals  Acute Rehab OT Goals Patient Stated Goal: get better and get home OT Goal Formulation: With patient Time For Goal Achievement: 09/17/19 Potential to Achieve Goals: Good  Plan Discharge plan remains appropriate;Frequency remains appropriate    Co-evaluation                 AM-PAC OT "6 Clicks" Daily Activity     Outcome Measure   Help from another person eating meals?: None Help from another person taking care of personal grooming?: None Help from another person toileting, which includes using toliet, bedpan, or urinal?: A Little Help from another person bathing (including washing, rinsing, drying)?: A Little Help from another person to put on and taking off regular upper body clothing?: None Help  from another person to put on and taking off regular lower body clothing?: A Little 6 Click Score: 21    End of Session Equipment Utilized During Treatment: Oxygen(15L via NRB)  OT Visit Diagnosis: Unsteadiness on feet (R26.81);Other abnormalities of gait and mobility (R26.89);Muscle weakness (generalized) (M62.81)   Activity Tolerance Treatment limited secondary to medical complications (Comment)(elevated HR)   Patient Left in chair;with call bell/phone within reach   Nurse Communication Mobility status;Other (comment)(HR; SpO2)        Time: 1308-6578 OT Time Calculation (min): 29 min  Charges: OT General Charges $OT Visit: 1 Visit OT Treatments $Self Care/Home Management : 8-22 mins $Therapeutic Activity: 8-22 mins Jesse Sans OTR/L Acute Rehabilitation Services Pager: [phone] Office: [phone]   Mickel Baas 09/12/2019, 1:42 PM

## 2019-09-13 LAB — COMPREHENSIVE METABOLIC PANEL
ALT: 70 U/L — ABNORMAL HIGH (ref 0–44)
AST: 21 U/L (ref 15–41)
Albumin: 3.9 g/dL (ref 3.5–5.0)
Alkaline Phosphatase: 68 U/L (ref 38–126)
Anion gap: 11 (ref 5–15)
BUN: 21 mg/dL — ABNORMAL HIGH (ref 6–20)
CO2: 29 mmol/L (ref 22–32)
Calcium: 9.2 mg/dL (ref 8.9–10.3)
Chloride: 98 mmol/L (ref 98–111)
Creatinine, Ser: 0.86 mg/dL (ref 0.61–1.24)
GFR calc Af Amer: >60 mL/min (ref >60)
GFR calc non Af Amer: >60 mL/min (ref >60)
Glucose, Bld: 141 mg/dL — ABNORMAL HIGH (ref 70–99)
Potassium: 3.7 mmol/L (ref 3.5–5.1)
Sodium: 138 mmol/L (ref 135–145)
Total Bilirubin: 0.9 mg/dL (ref 0.3–1.2)
Total Protein: 6.4 g/dL — ABNORMAL LOW (ref 6.5–8.1)

## 2019-09-13 LAB — CBC WITH DIFFERENTIAL/PLATELET
Abs Immature Granulocytes: 0.18 10*3/uL — ABNORMAL HIGH (ref 0.00–0.07)
Basophils Absolute: 0.1 10*3/uL (ref 0.0–0.1)
Basophils Relative: 1 %
Eosinophils Absolute: 0.2 10*3/uL (ref 0.0–0.5)
Eosinophils Relative: 2 %
HCT: 48.5 % (ref 39.0–52.0)
Hemoglobin: 16.2 g/dL (ref 13.0–17.0)
Immature Granulocytes: 2 %
Lymphocytes Relative: 12 %
Lymphs Abs: 1.1 10*3/uL (ref 0.7–4.0)
MCH: 29.8 pg (ref 26.0–34.0)
MCHC: 33.4 g/dL (ref 30.0–36.0)
MCV: 89.3 fL (ref 80.0–100.0)
Monocytes Absolute: 0.6 10*3/uL (ref 0.1–1.0)
Monocytes Relative: 7 %
Neutro Abs: 7.1 10*3/uL (ref 1.7–7.7)
Neutrophils Relative %: 76 %
Platelets: 176 10*3/uL (ref 150–400)
RBC: 5.43 MIL/uL (ref 4.22–5.81)
RDW: 14.2 % (ref 11.5–15.5)
WBC: 9.2 10*3/uL (ref 4.0–10.5)
nRBC: 0 % (ref 0.0–0.2)

## 2019-09-13 LAB — GLUCOSE, CAPILLARY
Glucose-Capillary: 185 mg/dL — ABNORMAL HIGH (ref 70–99)
Glucose-Capillary: 253 mg/dL — ABNORMAL HIGH (ref 70–99)
Glucose-Capillary: 211 mg/dL — ABNORMAL HIGH (ref 70–99)
Glucose-Capillary: 144 mg/dL — ABNORMAL HIGH (ref 70–99)

## 2019-09-13 LAB — MAGNESIUM: Magnesium: 2.4 mg/dL (ref 1.7–2.4)

## 2019-09-13 LAB — D-DIMER, QUANTITATIVE: D-Dimer, Quant: 1.44 ug/mL-FEU — ABNORMAL HIGH (ref 0.00–0.50)

## 2019-09-13 LAB — PHOSPHORUS: Phosphorus: 4.4 mg/dL (ref 2.5–4.6)

## 2019-09-13 LAB — FERRITIN: Ferritin: 503 ng/mL — ABNORMAL HIGH (ref 24–336)

## 2019-09-13 LAB — C-REACTIVE PROTEIN: CRP: 0.6 mg/dL (ref <1.0)

## 2019-09-13 NOTE — Plan of Care (Signed)
PATIENT CARE PLAN  Problem: Education: Goal: Ability to describe self-care measures that may prevent or decrease complications (Diabetes Survival Skills Education) will improve Outcome: Progressing   Problem: Coping: Goal: Ability to adjust to condition or change in health will improve Outcome: Progressing   Problem: Fluid Volume: Goal: Ability to maintain a balanced intake and output will improve Outcome: Progressing   Problem: Health Behavior/Discharge Planning: Goal: Ability to identify and utilize available resources and services will improve Outcome: Progressing Goal: Ability to manage health-related needs will improve Outcome: Progressing   Problem: Metabolic: Goal: Ability to maintain appropriate glucose levels will improve Outcome: Progressing   Problem: Nutritional: Goal: Maintenance of adequate nutrition will improve Outcome: Progressing   Problem: Skin Integrity: Goal: Risk for impaired skin integrity will decrease Outcome: Progressing   Problem: Tissue Perfusion: Goal: Adequacy of tissue perfusion will improve Outcome: Progressing   Problem: Education: Goal: Knowledge of General Education information will improve Description: Including pain rating scale, medication(s)/side effects and non-pharmacologic comfort measures Outcome: Progressing   Problem: Health Behavior/Discharge Planning: Goal: Ability to manage health-related needs will improve Outcome: Progressing   Problem: Clinical Measurements: Goal: Ability to maintain clinical measurements within normal limits will improve Outcome: Progressing Goal: Will remain free from infection Outcome: Progressing Goal: Diagnostic test results will improve Outcome: Progressing Goal: Respiratory complications will improve Outcome: Progressing Goal: Cardiovascular complication will be avoided Outcome: Progressing   Problem: Activity: Goal: Risk for activity intolerance will decrease Outcome: Progressing    Problem: Nutrition: Goal: Adequate nutrition will be maintained Outcome: Progressing   Problem: Coping: Goal: Level of anxiety will decrease Outcome: Progressing   Problem: Elimination: Goal: Will not experience complications related to bowel motility Outcome: Progressing Goal: Will not experience complications related to urinary retention Outcome: Progressing   Problem: Pain Managment: Goal: General experience of comfort will improve Outcome: Progressing   Problem: Safety: Goal: Ability to remain free from injury will improve Outcome: Progressing   Problem: Skin Integrity: Goal: Risk for impaired skin integrity will decrease Outcome: Progressing

## 2019-09-13 NOTE — Progress Notes (Signed)
Ambulation Note  Saturation Pre: 98% on 8L HFNC  Ambulation Distance: 150 ft x 1, 200 ft x 1  Saturation During Ambulation: 84-94% on 10 L HFNC  Notes: Pt very motivated to walk today. Walked independently. Pt walked 150 ft and sats were 86-94% on 10 L HFNC. Sats increased to over 90% after 2 minutes of seated rest. Pt then walked 200 ft and sats were 84-94% on 10 L HFNC. Sats dropped into the mid 70s upon sitting, and recovered into the 90s after 2 minutes. Pt returned to recliner with call bell within reach, sats were 97% on 8 L HFNC.   Alisia Ferrari, MS, ACSM CEP 9:41 AM 09/13/2019

## 2019-09-13 NOTE — Plan of Care (Signed)
  Problem: Education: Goal: Ability to describe self-care measures that may prevent or decrease complications (Diabetes Survival Skills Education) will improve Outcome: Progressing   Problem: Coping: Goal: Ability to adjust to condition or change in health will improve Outcome: Progressing   Problem: Fluid Volume: Goal: Ability to maintain a balanced intake and output will improve Outcome: Progressing   

## 2019-09-13 NOTE — Progress Notes (Signed)
Spoke with patients wife, Henry Miller, and gave her an update on patients status, POC, and discharge plan.

## 2019-09-13 NOTE — Progress Notes (Signed)
PROGRESS NOTE    Henry Miller  ZOX:[id number] DOB: 26-Mar-1971 DOA: 08/27/2019 PCP: Patient, No Pcp Per   Brief Narrative:  Henry Miller is an 49 y.o. male past medical history of prediabetes, DVT PE presents with Nana [place] 1 day history of worsening shortness of breath he relates he tested positive for SARS-CoV-2 on 08/26/2019.  Prior to this he had been having mild symptoms of nonproductive cough and Maryalyce Sanjuan low-grade fever accompanied by nausea vomiting diarrhea.  Checked his saturations at home and was 86% on room air, EMS placed him in 6 L and improved to 89 found tachycardic in the ED and satting 89% on 6 L we will start on remdesivir and steroids oxygen supplementation was increased to 15 L and saturations improved.  Actemra has been given  Assessment & Plan:   Principal Problem:   Pneumonia due to COVID-19 virus Active Problems:   Type 2 diabetes mellitus (HCC)   Morbid obesity with BMI of 40.0-44.9, adult (HCC)   Hypokalemia   Acute respiratory failure with hypoxia (HCC)   Uncontrolled diabetes mellitus with hyperglycemia, without long-term current use of insulin (HCC)  Acute Hypoxic Respiratory Failure 2/2 COVID 19 Pneumonia Currently on 10 L HFNC  CXR with worsening of bilateral heterogenous and interstitial airspace opacity S/p remdesivir Steroids being tapered S/p actemra x 2 Afebrile without WBC count, suspect persistent hypoxia related to COVID He's had CTA PE protocol x 2 which have been negative with LE [place] x1 negative I/O, daily weights - lasix as tolerated to maintain net negative to euvolemic (holding with soft pressures) Prone as able, IS, OOB Continue bid lovenox with severe illness  COVID-19 Labs  Recent Labs    09/11/19 1405 09/12/19 0326 09/13/19 0015  DDIMER 2.20* 1.66* 1.44*  FERRITIN  --  479* 503*  CRP  --  0.6 0.6    Lab Results  Component Value Date   SARSCOV2NAA Detected ([place]) 08/26/2019   Sinus Tachycardia: suspect related to COVID 19 infection and  hypoxia/SOB.  As above negative VTE studies.   Echo today with normal EF, diastolic dysfunction (see report) BP soft, no beta blocker for now Change inhalers to xopenex   Diabetes mellitus type 2, uncontrolled with hyperglycemia  HbA1c 9.9.  Patient was on Lantus and SSI.  However he developed Sontee Desena few episodes of hypoglycemia so his Lantus was discontinued.  Subsequently CBGs started climbing again.  Lantus was reintroduced.  Continue current dose for now as the plan is to start tapering his steroids with which we anticipate his glucose levels to start dropping. Will start mealtime and follow with tradjenta as well.   Morbid obesity Estimated body mass index is 38.6 kg/m as calculated from the following:   Height as of this encounter: 5\' 10"  (1.778 m).   Weight as of this encounter: 122 kg.  Hypokalemia: Repleted.  Monitor.  DVT prophylaxis: lovenox Code Status: full  Family Communication: none at bedside - called wife 1/14 Disposition Plan: none at bedside   Consultants:   none  Procedures:  Echo IMPRESSIONS    1. Left ventricular ejection fraction, by visual estimation, is 60 to 65%. The left ventricle has normal function. There is no left ventricular hypertrophy.  2. Left ventricular diastolic parameters are consistent with Grade I diastolic dysfunction (impaired relaxation).  3. The left ventricle has no regional wall motion abnormalities.  4. Global right ventricle was not well visualized.The right ventricular size is not well visualized. Right vetricular wall thickness was not assessed.  5. Left atrial size was normal.  6. Right atrial size was normal.  7. The mitral valve is normal in structure. No evidence of mitral valve regurgitation. No evidence of mitral stenosis.  8. The tricuspid valve is normal in structure.  9. The aortic valve was not well visualized. Aortic valve regurgitation is not visualized. No evidence of aortic valve sclerosis or stenosis. 10. TR  signal is inadequate for assessing pulmonary artery systolic pressure. 11. The inferior vena cava is normal in size with greater than 50% respiratory variability, suggesting right atrial pressure of 3 mmHg. 12. Technically difficult study with poor acoustic windows.  LE [place] Summary: Right: There is no evidence of deep vein thrombosis in the lower extremity. No cystic structure found in the popliteal fossa. Left: There is no evidence of deep vein thrombosis in the lower extremity. No cystic structure found in the popliteal fossa.   Antimicrobials:  Anti-infectives (From admission, onward)   Start     Dose/Rate Route Frequency Ordered Stop   08/29/19 1000  remdesivir 100 mg in sodium chloride 0.9 % 100 mL IVPB     100 mg 200 mL/hr over 30 Minutes Intravenous Daily 08/28/19 0042 09/01/19 1100   08/28/19 0300  remdesivir 200 mg in sodium chloride 0.9% 250 mL IVPB     200 mg 580 mL/hr over 30 Minutes Intravenous Once 08/28/19 0042 08/28/19 0413     Subjective: No new complaints, feels about the same  Objective: Vitals:   09/13/19 0400 09/13/19 0454 09/13/19 0717 09/13/19 1138  BP: 93/71  106/79 110/75  Pulse: 85  (!) 108 100  Resp: 20  20 20   Temp: 98 F (36.7 C)  98.9 F (37.2 C) 98.8 F (37.1 C)  TempSrc:   Oral Oral  SpO2: 99%  95% 91%  Weight:  121.1 kg    Height:        Intake/Output Summary (Last 24 hours) at 09/13/2019 1425 Last data filed at 09/13/2019 1257 Gross per 24 hour  Intake 897 ml  Output 725 ml  Net 172 ml   Filed Weights   09/10/19 0414 09/11/19 0500 09/13/19 0454  Weight: 122 kg 121 kg 121.1 kg    Examination:  General: No acute distress. Cardiovascular: Heart sounds show Melani Brisbane regular rate, and rhythm.  Lungs: Clear to auscultation bilaterally Abdomen: Soft, nontender, nondistended Neurological: Alert and oriented 3. Moves all extremities 4 . Cranial nerves II through XII grossly intact. Skin: Warm and dry. No rashes or lesions. Extremities: No  clubbing or cyanosis. No edema.   Data Reviewed: I have personally reviewed following labs and imaging studies  CBC: Recent Labs  Lab 09/08/19 0220 09/09/19 0320 09/11/19 1405 09/12/19 0326 09/13/19 0015  WBC 9.8 11.1* 9.7 7.3 9.2  NEUTROABS  --   --   --  5.0 7.1  HGB 15.9 15.9 16.7 15.9 16.2  HCT 47.6 48.0 49.1 46.5 48.5  MCV 88.1 89.2 86.7 88.7 89.3  PLT 225 237 204 176 478   Basic Metabolic Panel: Recent Labs  Lab 09/09/19 0320 09/10/19 0250 09/11/19 0417 09/12/19 0326 09/13/19 0015  NA 142 137 138 137 138  K 3.8 3.7 4.6 3.6 3.7  CL 96* 96* 99 98 98  CO2 27 27 25 28 29   GLUCOSE 148* 158* 191* 154* 141*  BUN 17 19 20 20  21*  CREATININE 0.88 0.91 0.87 0.96 0.86  CALCIUM 8.8* 9.3 9.3 8.9 9.2  MG  --   --   --  2.2  2.4  PHOS  --   --   --  4.9* 4.4   GFR: Estimated Creatinine Clearance: 137 mL/min (by C-G formula based on SCr of 0.86 mg/dL). Liver Function Tests: Recent Labs  Lab 09/07/19 0436 09/08/19 0220 09/09/19 0320 09/12/19 0326 09/13/19 0015  AST 32 26 30 28 21   ALT 42 44 58* 74* 70*  ALKPHOS 76 71 74 64 68  BILITOT 1.1 0.9 1.0 1.1 0.9  PROT 6.0* 6.0* 6.1* 6.0* 6.4*  ALBUMIN 3.4* 3.5 3.7 3.6 3.9   No results for input(s): LIPASE, AMYLASE in the last 168 hours. No results for input(s): AMMONIA in the last 168 hours. Coagulation Profile: No results for input(s): INR, PROTIME in the last 168 hours. Cardiac Enzymes: No results for input(s): CKTOTAL, CKMB, CKMBINDEX, TROPONINI in the last 168 hours. BNP (last 3 results) No results for input(s): PROBNP in the last 8760 hours. HbA1C: No results for input(s): HGBA1C in the last 72 hours. CBG: Recent Labs  Lab 09/12/19 1228 09/12/19 1644 09/12/19 2016 09/13/19 0712 09/13/19 1136  GLUCAP 225* 267* 229* 185* 253*   Lipid Profile: No results for input(s): CHOL, HDL, LDLCALC, TRIG, CHOLHDL, LDLDIRECT in the last 72 hours. Thyroid Function Tests: Recent Labs    09/11/19 0417  TSH 2.491    Anemia Panel: Recent Labs    09/12/19 0326 09/13/19 0015  FERRITIN 479* 503*   Sepsis Labs: No results for input(s): PROCALCITON, LATICACIDVEN in the last 168 hours.  No results found for this or any previous visit (from the past 240 hour(s)).       Radiology Studies: ECHOCARDIOGRAM COMPLETE  Result Date: 09/11/2019   ECHOCARDIOGRAM REPORT   Patient Name:   KALLUM JORGENSEN Date of Exam: 09/11/2019 Medical Rec #:  09/13/2019      Height:       70.0 in Accession #:    [id number]     Weight:       266.8 lb Date of Birth:  Aug 01, 1971      BSA:          2.36 m Patient Age:    48 years       BP:           108/79 mmHg Patient Gender: M              HR:           110 bpm. Exam Location:  Inpatient Procedure: 2D Echo Indications:    Elevated brain natriuretic peptide (BNP) level  History:        Patient has no prior history of Echocardiogram examinations.                 Risk Factors:Former Smoker and Diabetes.  Sonographer:    09/15/1971 RDCS (AE) Referring Phys: [phone] Enio Hornback CALDWELL POWELL JR  Sonographer Comments: No parasternal window. Image acquisition challenging due to patient body habitus. IMPRESSIONS  1. Left ventricular ejection fraction, by visual estimation, is 60 to 65%. The left ventricle has normal function. There is no left ventricular hypertrophy.  2. Left ventricular diastolic parameters are consistent with Grade I diastolic dysfunction (impaired relaxation).  3. The left ventricle has no regional wall motion abnormalities.  4. Global right ventricle was not well visualized.The right ventricular size is not well visualized. Right vetricular wall thickness was not assessed.  5. Left atrial size was normal.  6. Right atrial size was normal.  7. The mitral valve is normal in structure. No evidence of  mitral valve regurgitation. No evidence of mitral stenosis.  8. The tricuspid valve is normal in structure.  9. The aortic valve was not well visualized. Aortic valve regurgitation is not  visualized. No evidence of aortic valve sclerosis or stenosis. 10. TR signal is inadequate for assessing pulmonary artery systolic pressure. 11. The inferior vena cava is normal in size with greater than 50% respiratory variability, suggesting right atrial pressure of 3 mmHg. 12. Technically difficult study with poor acoustic windows. FINDINGS  Left Ventricle: Left ventricular ejection fraction, by visual estimation, is 60 to 65%. The left ventricle has normal function. The left ventricle has no regional wall motion abnormalities. The left ventricular internal cavity size was the left ventricle is normal in size. There is no left ventricular hypertrophy. Left ventricular diastolic parameters are consistent with Grade I diastolic dysfunction (impaired relaxation). Right Ventricle: The right ventricular size is not well visualized. Right vetricular wall thickness was not assessed. Global RV systolic function is was not well visualized. Left Atrium: Left atrial size was normal in size. Right Atrium: Right atrial size was normal in size Pericardium: There is no evidence of pericardial effusion. Mitral Valve: The mitral valve is normal in structure. No evidence of mitral valve regurgitation. No evidence of mitral valve stenosis by observation. Tricuspid Valve: The tricuspid valve is normal in structure. Tricuspid valve regurgitation is not demonstrated. Aortic Valve: The aortic valve was not well visualized. Aortic valve regurgitation is not visualized. The aortic valve is structurally normal, with no evidence of sclerosis or stenosis. Pulmonic Valve: The pulmonic valve was not well visualized. Pulmonic valve regurgitation is not visualized. Pulmonic regurgitation is not visualized. Aorta: The aortic root is normal in size and structure. Venous: The inferior vena cava is normal in size with greater than 50% respiratory variability, suggesting right atrial pressure of 3 mmHg. IAS/Shunts: No atrial level shunt detected by  color flow Doppler.  LEFT ATRIUM             Index LA Vol (A2C):   36.3 ml 15.39 ml/m LA Vol (A4C):   34.4 ml 14.58 ml/m LA Biplane Vol: 36.6 ml 15.51 ml/m  AORTIC VALVE LVOT Vmax:   63.50 cm/s LVOT Vmean:  47.400 cm/s LVOT VTI:    0.071 m  SHUNTS Systemic VTI: 0.07 m  Marca Ancona MD Electronically signed by Marca Ancona MD Signature Date/Time: 09/11/2019/5:18:27 PM    Final         Scheduled Meds: . vitamin C  500 mg Oral Daily  . dexamethasone (DECADRON) injection  2 mg Intravenous Q24H  . enoxaparin (LOVENOX) injection  65 mg Subcutaneous Q12H  . fluticasone  2 spray Each Nare Daily  . insulin aspart  0-20 Units Subcutaneous TID WC  . insulin aspart  0-5 Units Subcutaneous QHS  . insulin aspart  4 Units Subcutaneous TID WC  . insulin glargine  8 Units Subcutaneous Daily  . levalbuterol  2 puff Inhalation Q6H  . linagliptin  5 mg Oral Daily  . mometasone-formoterol  2 puff Inhalation BID  . pantoprazole  40 mg Oral BID  . potassium chloride  20 mEq Oral Daily  . zinc sulfate  220 mg Oral Daily   Continuous Infusions:   LOS: 17 days    Time spent: over 30 min    Lacretia Nicks, MD [facility] Pager AMION  If 7PM-7AM, please contact night-coverage [email or web address] Password [email or web address] 09/13/2019, 2:25 PM

## 2019-09-14 LAB — GLUCOSE, CAPILLARY
Glucose-Capillary: 226 mg/dL — ABNORMAL HIGH (ref 70–99)
Glucose-Capillary: 160 mg/dL — ABNORMAL HIGH (ref 70–99)
Glucose-Capillary: 189 mg/dL — ABNORMAL HIGH (ref 70–99)

## 2019-09-14 LAB — CBC WITH DIFFERENTIAL/PLATELET
Abs Immature Granulocytes: 0.21 10*3/uL — ABNORMAL HIGH (ref 0.00–0.07)
Basophils Absolute: 0.1 10*3/uL (ref 0.0–0.1)
Basophils Relative: 1 %
Eosinophils Absolute: 0.4 10*3/uL (ref 0.0–0.5)
Eosinophils Relative: 6 %
HCT: 47.1 % (ref 39.0–52.0)
Hemoglobin: 15.7 g/dL (ref 13.0–17.0)
Immature Granulocytes: 3 %
Lymphocytes Relative: 21 %
Lymphs Abs: 1.5 10*3/uL (ref 0.7–4.0)
MCH: 29.7 pg (ref 26.0–34.0)
MCHC: 33.3 g/dL (ref 30.0–36.0)
MCV: 89 fL (ref 80.0–100.0)
Monocytes Absolute: 0.6 10*3/uL (ref 0.1–1.0)
Monocytes Relative: 8 %
Neutro Abs: 4.4 10*3/uL (ref 1.7–7.7)
Neutrophils Relative %: 61 %
Platelets: 157 10*3/uL (ref 150–400)
RBC: 5.29 MIL/uL (ref 4.22–5.81)
RDW: 14.1 % (ref 11.5–15.5)
WBC: 7.1 10*3/uL (ref 4.0–10.5)
nRBC: 0 % (ref 0.0–0.2)

## 2019-09-14 LAB — D-DIMER, QUANTITATIVE: D-Dimer, Quant: 1.06 ug/mL-FEU — ABNORMAL HIGH (ref 0.00–0.50)

## 2019-09-14 LAB — COMPREHENSIVE METABOLIC PANEL
ALT: 60 U/L — ABNORMAL HIGH (ref 0–44)
AST: 23 U/L (ref 15–41)
Albumin: 3.9 g/dL (ref 3.5–5.0)
Alkaline Phosphatase: 62 U/L (ref 38–126)
Anion gap: 14 (ref 5–15)
BUN: 19 mg/dL (ref 6–20)
CO2: 24 mmol/L (ref 22–32)
Calcium: 8.8 mg/dL — ABNORMAL LOW (ref 8.9–10.3)
Chloride: 99 mmol/L (ref 98–111)
Creatinine, Ser: 0.78 mg/dL (ref 0.61–1.24)
GFR calc Af Amer: >60 mL/min (ref >60)
GFR calc non Af Amer: >60 mL/min (ref >60)
Glucose, Bld: 113 mg/dL — ABNORMAL HIGH (ref 70–99)
Potassium: 3.5 mmol/L (ref 3.5–5.1)
Sodium: 137 mmol/L (ref 135–145)
Total Bilirubin: 1.1 mg/dL (ref 0.3–1.2)
Total Protein: 6.4 g/dL — ABNORMAL LOW (ref 6.5–8.1)

## 2019-09-14 LAB — PHOSPHORUS: Phosphorus: 4.3 mg/dL (ref 2.5–4.6)

## 2019-09-14 LAB — MAGNESIUM: Magnesium: 2.3 mg/dL (ref 1.7–2.4)

## 2019-09-14 LAB — FERRITIN: Ferritin: 450 ng/mL — ABNORMAL HIGH (ref 24–336)

## 2019-09-14 LAB — C-REACTIVE PROTEIN: CRP: <0.5 mg/dL (ref <1.0)

## 2019-09-14 NOTE — Progress Notes (Signed)
Physical Therapy Treatment Patient Details Name: Henry Miller MRN: [id number] DOB: 1971-06-17 Today's Date: 09/14/2019    History of Present Illness 49 y.o. male admitted on 08/27/19 for acute respiratory failure with hypoxia due to COVID 19 viral PNA, hypokalemia.  Pt with significant PMH of DM2, morbid obesity.     PT Comments    Pt making tremendous progress with mobility and also activity tolerance. He is at mod I with in room activities and ambulating within area around bed, with ambulation greater distances needs stand by assist w/ line management and also monitoring sats. Today pt ambulated  1x  192 ft w/ SBA and noted to desat to min of 82%, 1 x 275ft w/ SBA and desat to min of 80%, 1 x 276ft with SBA and desat to min of 85%. Pt ambulated on 6L/min via HFNC and needed approx 2-36min for recover to 90s saturation. Therapist reinforced importance of using incentive spirometer and flutter valve also completing exercises during down time.     Follow Up Recommendations  Supervision - Intermittent     Equipment Recommendations       Recommendations for Other Services       Precautions / Restrictions Precautions Precautions: Other (comment) Precaution Comments: 02 and HR w/ activity Restrictions Weight Bearing Restrictions: No    Mobility  Bed Mobility               General bed mobility comments: pt in recliner at therapist arriva;  Transfers Overall transfer level: Modified independent Equipment used: None Transfers: Sit to/from [employer] Sit to Stand: Modified independent (Device/Increase time) Stand pivot transfers: Modified independent (Device/Increase time)          Ambulation/Gait Ambulation/Gait assistance: Supervision Gait Distance (Feet): 288 Feet Assistive device: None Gait Pattern/deviations: Step-through pattern Gait velocity: decreased   General Gait Details:  1x 192, 1 x 240 1 x 288 ft with seated rest and breathing breaks  inbetween   Stairs             Wheelchair Mobility    Modified Rankin (Stroke Patients Only)       Balance Overall balance assessment: No apparent balance deficits (not formally assessed)                                          Cognition Arousal/Alertness: Awake/alert Behavior During Therapy: WFL for tasks assessed/performed Overall Cognitive Status: Within Functional Limits for tasks assessed                                 General Comments: motived and encouraged by progress      Exercises      General Comments General comments (skin integrity, edema, etc.): 6L HFNC with ambulation ambulated 111ft desat  to 82%, 258ft desat to 80% and 224ft desat to 85% recovers very quickly with pursed lip breathing      Pertinent Vitals/Pain Pain Assessment: No/denies pain    Home Living                      Prior Function            PT Goals (current goals can now be found in the care plan section) Acute Rehab PT Goals Patient Stated Goal: feels much better and feels like he's improving PT  Goal Formulation: With patient Time For Goal Achievement: 09/16/19 Potential to Achieve Goals: Fair Progress towards PT goals: Progressing toward goals    Frequency    Min 3X/week      PT Plan Current plan remains appropriate    Co-evaluation              AM-PAC PT "6 Clicks" Mobility   Outcome Measure  Help needed turning from your back to your side while in a flat bed without using bedrails?: None Help needed moving from lying on your back to sitting on the side of a flat bed without using bedrails?: None Help needed moving to and from a bed to a chair (including a wheelchair)?: None Help needed standing up from a chair using your arms (e.g., wheelchair or bedside chair)?: None Help needed to walk in hospital room?: A Little Help needed climbing 3-5 steps with a railing? : A Little 6 Click Score: 22    End of  Session Equipment Utilized During Treatment: Oxygen Activity Tolerance: Patient tolerated treatment well Patient left: in chair;with call bell/phone within reach   PT Visit Diagnosis: Difficulty in walking, not elsewhere classified (R26.2)     Time: 5784-6962 PT Time Calculation (min) (ACUTE ONLY): 25 min  Charges:  $Gait Training: 23-37 mins                     Horald Chestnut, PT    Delford Field 09/14/2019, 1:32 PM

## 2019-09-14 NOTE — Progress Notes (Addendum)
PROGRESS NOTE    Henry Miller  DJS:[id number] DOB: [birth year] DOA: 08/27/2019 PCP: Patient, No Pcp Per   Brief Narrative:  Henry Miller is an 49 y.o. male past medical history of prediabetes, DVT PE presents with Andoni Busch 1 day history of worsening shortness of breath he relates he tested positive for SARS-CoV-2 on 08/26/2019.  Prior to this he had been having mild symptoms of nonproductive cough and Aiyah Scarpelli low-grade fever accompanied by nausea vomiting diarrhea.  Checked his saturations at home and was 86% on room air, EMS placed him in 6 L and improved to 89 found tachycardic in the ED and satting 89% on 6 L we will start on remdesivir and steroids oxygen supplementation was increased to 15 L and saturations improved.  Actemra has been given  Assessment & Plan:   Principal Problem:   Pneumonia due to COVID-19 virus Active Problems:   Type 2 diabetes mellitus (HCC)   Morbid obesity with BMI of 40.0-44.9, adult (HCC)   Hypokalemia   Acute respiratory failure with hypoxia (HCC)   Uncontrolled diabetes mellitus with hyperglycemia, without long-term current use of insulin (HCC)  Acute Hypoxic Respiratory Failure 2/2 COVID 19 Pneumonia Weaned to 5 L this morning CXR with worsening of bilateral heterogenous and interstitial airspace opacity S/p remdesivir Steroids being tapered S/p actemra x 2 Afebrile without WBC count, suspect persistent hypoxia related to COVID He's had CTA PE protocol x 2 which have been negative with LE [place] x1 negative I/O, daily weights - lasix as tolerated to maintain net negative to euvolemic (holding with soft pressures) Prone as able, IS, OOB Continue bid lovenox with severe illness  COVID-19 Labs  Recent Labs    09/12/19 0326 09/13/19 0015 09/14/19 0116  DDIMER 1.66* 1.44* 1.06*  FERRITIN 479* 503* 450*  CRP 0.6 0.6 <0.5    Lab Results  Component Value Date   SARSCOV2NAA Detected (Makilah Dowda) 08/26/2019   Sinus Tachycardia: suspect related to COVID 19 infection  and hypoxia/SOB.  As above negative VTE studies.   Echo today with normal EF, diastolic dysfunction (see report) BP soft, no beta blocker for now Change inhalers to xopenex   Diabetes mellitus type 2, uncontrolled with hyperglycemia  HbA1c 9.9.  Patient was on Lantus and SSI.  However he developed Debbi Strandberg few episodes of hypoglycemia so his Lantus was discontinued.  Subsequently CBGs started climbing again.  Lantus was reintroduced.  Continue current dose for now as the plan is to start tapering his steroids with which we anticipate his glucose levels to start dropping. Will start mealtime and follow with tradjenta as well.   Morbid obesity Estimated body mass index is 38.6 kg/m as calculated from the following:   Height as of this encounter: 5\' 10"  (1.778 m).   Weight as of this encounter: 122 kg.  Hypokalemia: Repleted.  Monitor.  DVT prophylaxis: lovenox Code Status: full  Family Communication: none at bedside - called wife 1/16 Disposition Plan: none at bedside   Consultants:   none  Procedures:  Echo IMPRESSIONS    1. Left ventricular ejection fraction, by visual estimation, is 60 to 65%. The left ventricle has normal function. There is no left ventricular hypertrophy.  2. Left ventricular diastolic parameters are consistent with Grade I diastolic dysfunction (impaired relaxation).  3. The left ventricle has no regional wall motion abnormalities.  4. Global right ventricle was not well visualized.The right ventricular size is not well visualized. Right vetricular wall thickness was not assessed.  5. Left atrial  size was normal.  6. Right atrial size was normal.  7. The mitral valve is normal in structure. No evidence of mitral valve regurgitation. No evidence of mitral stenosis.  8. The tricuspid valve is normal in structure.  9. The aortic valve was not well visualized. Aortic valve regurgitation is not visualized. No evidence of aortic valve sclerosis or stenosis. 10. TR  signal is inadequate for assessing pulmonary artery systolic pressure. 11. The inferior vena cava is normal in size with greater than 50% respiratory variability, suggesting right atrial pressure of 3 mmHg. 12. Technically difficult study with poor acoustic windows.  LE [place] Summary: Right: There is no evidence of deep vein thrombosis in the lower extremity. No cystic structure found in the popliteal fossa. Left: There is no evidence of deep vein thrombosis in the lower extremity. No cystic structure found in the popliteal fossa.   Antimicrobials:  Anti-infectives (From admission, onward)   Start     Dose/Rate Route Frequency Ordered Stop   08/29/19 1000  remdesivir 100 mg in sodium chloride 0.9 % 100 mL IVPB     100 mg 200 mL/hr over 30 Minutes Intravenous Daily 08/28/19 0042 09/01/19 1100   08/28/19 0300  remdesivir 200 mg in sodium chloride 0.9% 250 mL IVPB     200 mg 580 mL/hr over 30 Minutes Intravenous Once 08/28/19 0042 08/28/19 0413     Subjective: No new complaints, feeling better  Objective: Vitals:   09/14/19 0500 09/14/19 0530 09/14/19 0800 09/14/19 1200  BP:  112/72 104/68   Pulse:  95 92 (!) 112  Resp:  20 (!) 28 20  Temp:  98.9 F (37.2 C) 98.6 F (37 C) 97.7 F (36.5 C)  TempSrc:  Oral Oral Oral  SpO2:  96% (!) 88% 96%  Weight: 130.8 kg     Height:        Intake/Output Summary (Last 24 hours) at 09/14/2019 1457 Last data filed at 09/14/2019 0800 Gross per 24 hour  Intake 480 ml  Output 180 ml  Net 300 ml   Filed Weights   09/11/19 0500 09/13/19 0454 09/14/19 0500  Weight: 121 kg 121.1 kg 130.8 kg    Examination:  General: No acute distress. Cardiovascular: Heart sounds show [place] Perra regular rate, and rhythm.  Lungs: Clear to auscultation bilaterally  Abdomen: Soft, nontender, nondistended  Neurological: Alert and oriented 3. Moves all extremities 4 . Cranial nerves II through XII grossly intact. Skin: Warm and dry. No rashes or lesions. Extremities:  No clubbing or cyanosis. No edema.    Data Reviewed: I have personally reviewed following labs and imaging studies  CBC: Recent Labs  Lab 09/09/19 0320 09/11/19 1405 09/12/19 0326 09/13/19 0015 09/14/19 0116  WBC 11.1* 9.7 7.3 9.2 7.1  NEUTROABS  --   --  5.0 7.1 4.4  HGB 15.9 16.7 15.9 16.2 15.7  HCT 48.0 49.1 46.5 48.5 47.1  MCV 89.2 86.7 88.7 89.3 89.0  PLT 237 204 176 176 562   Basic Metabolic Panel: Recent Labs  Lab 09/10/19 0250 09/11/19 0417 09/12/19 0326 09/13/19 0015 09/14/19 0116  NA 137 138 137 138 137  K 3.7 4.6 3.6 3.7 3.5  CL 96* 99 98 98 99  CO2 27 25 28 29 24   GLUCOSE 158* 191* 154* 141* 113*  BUN 19 20 20  21* 19  CREATININE 0.91 0.87 0.96 0.86 0.78  CALCIUM 9.3 9.3 8.9 9.2 8.8*  MG  --   --  2.2 2.4 2.3  PHOS  --   --  4.9* 4.4 4.3   GFR: Estimated Creatinine Clearance: 153.5 mL/min (by C-G formula based on SCr of 0.78 mg/dL). Liver Function Tests: Recent Labs  Lab 09/08/19 0220 09/09/19 0320 09/12/19 0326 09/13/19 0015 09/14/19 0116  AST 26 30 28 21 23   ALT 44 58* 74* 70* 60*  ALKPHOS 71 74 64 68 62  BILITOT 0.9 1.0 1.1 0.9 1.1  PROT 6.0* 6.1* 6.0* 6.4* 6.4*  ALBUMIN 3.5 3.7 3.6 3.9 3.9   No results for input(s): LIPASE, AMYLASE in the last 168 hours. No results for input(s): AMMONIA in the last 168 hours. Coagulation Profile: No results for input(s): INR, PROTIME in the last 168 hours. Cardiac Enzymes: No results for input(s): CKTOTAL, CKMB, CKMBINDEX, TROPONINI in the last 168 hours. BNP (last 3 results) No results for input(s): PROBNP in the last 8760 hours. HbA1C: No results for input(s): HGBA1C in the last 72 hours. CBG: Recent Labs  Lab 09/13/19 0712 09/13/19 1136 09/13/19 1612 09/13/19 2120 09/14/19 1133  GLUCAP 185* 253* 211* 144* 226*   Lipid Profile: No results for input(s): CHOL, HDL, LDLCALC, TRIG, CHOLHDL, LDLDIRECT in the last 72 hours. Thyroid Function Tests: No results for input(s): TSH, T4TOTAL, FREET4,  T3FREE, THYROIDAB in the last 72 hours. Anemia Panel: Recent Labs    09/13/19 0015 09/14/19 0116  FERRITIN 503* 450*   Sepsis Labs: No results for input(s): PROCALCITON, LATICACIDVEN in the last 168 hours.  No results found for this or any previous visit (from the past 240 hour(s)).       Radiology Studies: No results found.      Scheduled Meds: . vitamin C  500 mg Oral Daily  . enoxaparin (LOVENOX) injection  65 mg Subcutaneous Q12H  . fluticasone  2 spray Each Nare Daily  . insulin aspart  0-20 Units Subcutaneous TID WC  . insulin aspart  0-5 Units Subcutaneous QHS  . insulin aspart  4 Units Subcutaneous TID WC  . insulin glargine  8 Units Subcutaneous Daily  . levalbuterol  2 puff Inhalation Q6H  . linagliptin  5 mg Oral Daily  . mometasone-formoterol  2 puff Inhalation BID  . pantoprazole  40 mg Oral BID  . potassium chloride  20 mEq Oral Daily  . zinc sulfate  220 mg Oral Daily   Continuous Infusions:   LOS: 18 days    Time spent: over 30 min    09/16/19, MD [facility] Pager AMION  If 7PM-7AM, please contact night-coverage [email or web address] Password [facility] 09/14/2019, 2:57 PM

## 2019-09-14 NOTE — Progress Notes (Signed)
Called patient's wife Herbert Seta to give PM update.  No answer.  Left message saying to call back if desired.

## 2019-09-15 LAB — CBC WITH DIFFERENTIAL/PLATELET
Abs Immature Granulocytes: 0.13 10*3/uL — ABNORMAL HIGH (ref 0.00–0.07)
Basophils Absolute: 0 10*3/uL (ref 0.0–0.1)
Basophils Relative: 1 %
Eosinophils Absolute: 0.5 10*3/uL (ref 0.0–0.5)
Eosinophils Relative: 8 %
HCT: 43.9 % (ref 39.0–52.0)
Hemoglobin: 14.8 g/dL (ref 13.0–17.0)
Immature Granulocytes: 2 %
Lymphocytes Relative: 22 %
Lymphs Abs: 1.4 10*3/uL (ref 0.7–4.0)
MCH: 30 pg (ref 26.0–34.0)
MCHC: 33.7 g/dL (ref 30.0–36.0)
MCV: 88.9 fL (ref 80.0–100.0)
Monocytes Absolute: 0.6 10*3/uL (ref 0.1–1.0)
Monocytes Relative: 10 %
Neutro Abs: 3.6 10*3/uL (ref 1.7–7.7)
Neutrophils Relative %: 57 %
Platelets: 146 10*3/uL — ABNORMAL LOW (ref 150–400)
RBC: 4.94 MIL/uL (ref 4.22–5.81)
RDW: 14.2 % (ref 11.5–15.5)
WBC: 6.2 10*3/uL (ref 4.0–10.5)
nRBC: 0 % (ref 0.0–0.2)

## 2019-09-15 LAB — COMPREHENSIVE METABOLIC PANEL
ALT: 57 U/L — ABNORMAL HIGH (ref 0–44)
AST: 22 U/L (ref 15–41)
Albumin: 3.6 g/dL (ref 3.5–5.0)
Alkaline Phosphatase: 55 U/L (ref 38–126)
Anion gap: 10 (ref 5–15)
BUN: 17 mg/dL (ref 6–20)
CO2: 27 mmol/L (ref 22–32)
Calcium: 8.8 mg/dL — ABNORMAL LOW (ref 8.9–10.3)
Chloride: 100 mmol/L (ref 98–111)
Creatinine, Ser: 0.69 mg/dL (ref 0.61–1.24)
GFR calc Af Amer: >60 mL/min (ref >60)
GFR calc non Af Amer: >60 mL/min (ref >60)
Glucose, Bld: 121 mg/dL — ABNORMAL HIGH (ref 70–99)
Potassium: 3.6 mmol/L (ref 3.5–5.1)
Sodium: 137 mmol/L (ref 135–145)
Total Bilirubin: 1 mg/dL (ref 0.3–1.2)
Total Protein: 5.9 g/dL — ABNORMAL LOW (ref 6.5–8.1)

## 2019-09-15 LAB — GLUCOSE, CAPILLARY
Glucose-Capillary: 139 mg/dL — ABNORMAL HIGH (ref 70–99)
Glucose-Capillary: 192 mg/dL — ABNORMAL HIGH (ref 70–99)
Glucose-Capillary: 169 mg/dL — ABNORMAL HIGH (ref 70–99)
Glucose-Capillary: 122 mg/dL — ABNORMAL HIGH (ref 70–99)

## 2019-09-15 LAB — MAGNESIUM: Magnesium: 2.2 mg/dL (ref 1.7–2.4)

## 2019-09-15 LAB — FERRITIN: Ferritin: 356 ng/mL — ABNORMAL HIGH (ref 24–336)

## 2019-09-15 LAB — PHOSPHORUS: Phosphorus: 4.6 mg/dL (ref 2.5–4.6)

## 2019-09-15 LAB — C-REACTIVE PROTEIN: CRP: 0.6 mg/dL (ref <1.0)

## 2019-09-15 LAB — D-DIMER, QUANTITATIVE: D-Dimer, Quant: 0.79 ug/mL-FEU — ABNORMAL HIGH (ref 0.00–0.50)

## 2019-09-15 MED ORDER — HYDROCORTISONE (PERIANAL) 2.5 % EX CREA
1.0000 "application " | TOPICAL_CREAM | Freq: Four times a day (QID) | CUTANEOUS | Status: DC | PRN
  Filled 2019-09-15: qty 28.35

## 2019-09-15 MED ORDER — ENOXAPARIN SODIUM 80 MG/0.8ML ~~LOC~~ SOLN
65.0000 mg | SUBCUTANEOUS | Status: DC
  Administered 2019-09-16 – 2019-09-17 (×2): 65 mg via SUBCUTANEOUS
  Filled 2019-09-15 (×2): qty 0.8

## 2019-09-15 NOTE — Progress Notes (Addendum)
PROGRESS NOTE    [name]  PZW:[id number] DOB: 1970-12-11 DOA: 08/27/2019 PCP: Patient, No Pcp Per   Brief Narrative:  Henry Miller is an 49 y.o. male past medical history of prediabetes, DVT PE presents with Saffron Busey 1 day history of worsening shortness of breath he relates he tested positive for SARS-CoV-2 on 08/26/2019.  Prior to this he had been having mild symptoms of nonproductive cough and Justyne Roell low-grade fever accompanied by nausea vomiting diarrhea.  Checked his saturations at home and was 86% on room air, EMS placed him in 6 L and improved to 89 found tachycardic in the ED and satting 89% on 6 L we will start on remdesivir and steroids oxygen supplementation was increased to 15 L and saturations improved.  Actemra has been given  Assessment & Plan:   Principal Problem:   Pneumonia due to COVID-19 virus Active Problems:   Type 2 diabetes mellitus ([place])   Morbid obesity with BMI of 40.0-44.9, adult (HCC)   Hypokalemia   Acute respiratory failure with hypoxia ([place])   Uncontrolled diabetes mellitus with hyperglycemia, without long-term current use of insulin (HCC)  Acute Hypoxic Respiratory Failure 2/2 COVID 19 Pneumonia Weaned to 3 L today CXR with worsening of bilateral heterogenous and interstitial airspace opacity S/p remdesivir Steroids being tapered S/p actemra x 2 Afebrile without WBC count, suspect persistent hypoxia related to COVID He's had CTA PE protocol x 2 which have been negative with LE [place] x1 negative I/O, daily weights - lasix as tolerated to maintain net negative to euvolemic (holding with soft pressures) Prone as able, IS, OOB Continue bid lovenox with severe illness  COVID-19 Labs  Recent Labs    09/13/19 0015 09/14/19 0116 09/15/19 0116  DDIMER 1.44* 1.06* 0.79*  FERRITIN 503* 450* 356*  CRP 0.6 <0.5 0.6    Lab Results  Component Value Date   SARSCOV2NAA Detected (Sayre Witherington) 08/26/2019   Sinus Tachycardia: suspect related to COVID 19 infection and  hypoxia/SOB.  As above negative VTE studies.   Echo with normal EF, diastolic dysfunction (see report) BP soft, no beta blocker for now Change inhalers to xopenex   Diabetes mellitus type 2, uncontrolled with hyperglycemia  HbA1c 9.9.  Patient was on Lantus and SSI.  However he developed Tryone Kille few episodes of hypoglycemia so his Lantus was discontinued.  Subsequently CBGs started climbing again.  Lantus was reintroduced.  Continue current dose for now as the plan is to start tapering his steroids with which we anticipate his glucose levels to start dropping. Will start mealtime and follow with tradjenta as well.   Morbid obesity Estimated body mass index is 38.6 kg/m as calculated from the following:   Height as of this encounter: 5\' 10"  (1.778 m).   Weight as of this encounter: 122 kg.  Hypokalemia: Repleted.  Monitor.  DVT prophylaxis: lovenox Code Status: full  Family Communication: none at bedside - called wife 1/17 Disposition Plan: none at bedside   Consultants:   none  Procedures:  Echo IMPRESSIONS    1. Left ventricular ejection fraction, by visual estimation, is 60 to 65%. The left ventricle has normal function. There is no left ventricular hypertrophy.  2. Left ventricular diastolic parameters are consistent with Grade I diastolic dysfunction (impaired relaxation).  3. The left ventricle has no regional wall motion abnormalities.  4. Global right ventricle was not well visualized.The right ventricular size is not well visualized. Right vetricular wall thickness was not assessed.  5. Left atrial size was  normal.  6. Right atrial size was normal.  7. The mitral valve is normal in structure. No evidence of mitral valve regurgitation. No evidence of mitral stenosis.  8. The tricuspid valve is normal in structure.  9. The aortic valve was not well visualized. Aortic valve regurgitation is not visualized. No evidence of aortic valve sclerosis or stenosis. 10. TR signal is  inadequate for assessing pulmonary artery systolic pressure. 11. The inferior vena cava is normal in size with greater than 50% respiratory variability, suggesting right atrial pressure of 3 mmHg. 12. Technically difficult study with poor acoustic windows.  LE [place] Summary: Right: There is no evidence of deep vein thrombosis in the lower extremity. No cystic structure found in the popliteal fossa. Left: There is no evidence of deep vein thrombosis in the lower extremity. No cystic structure found in the popliteal fossa.   Antimicrobials:  Anti-infectives (From admission, onward)   Start     Dose/Rate Route Frequency Ordered Stop   08/29/19 1000  remdesivir 100 mg in sodium chloride 0.9 % 100 mL IVPB     100 mg 200 mL/hr over 30 Minutes Intravenous Daily 08/28/19 0042 09/01/19 1100   08/28/19 0300  remdesivir 200 mg in sodium chloride 0.9% 250 mL IVPB     200 mg 580 mL/hr over 30 Minutes Intravenous Once 08/28/19 0042 08/28/19 0413     Subjective: Excited that he's feeling better  Objective: Vitals:   09/15/19 1230 09/15/19 1300 09/15/19 1330 09/15/19 1400  BP:      Pulse: (!) 107 (!) 106 (!) 110 (!) 121  Resp:  (!) 24 (!) 31 17  Temp:      TempSrc:      SpO2: 100% 96% 99% (!) 88%  Weight:      Height:        Intake/Output Summary (Last 24 hours) at 09/15/2019 1430 Last data filed at 09/15/2019 0800 Gross per 24 hour  Intake --  Output 350 ml  Net -350 ml   Filed Weights   09/13/19 0454 09/14/19 0500 09/15/19 0419  Weight: 121.1 kg 130.8 kg 130.8 kg    Examination:  General: No acute distress. Cardiovascular: Heart sounds show Payslie Mccaig regular rate, and rhythm. Lungs: Clear to auscultation bilaterally  Abdomen: Soft, nontender, nondistended  Neurological: Alert and oriented 3. Moves all extremities 4.  Cranial nerves II through XII grossly intact. Skin: Warm and dry. No rashes or lesions. Extremities: No clubbing or cyanosis. No edema.   Data Reviewed: I have  personally reviewed following labs and imaging studies  CBC: Recent Labs  Lab 09/11/19 1405 09/12/19 0326 09/13/19 0015 09/14/19 0116 09/15/19 0116  WBC 9.7 7.3 9.2 7.1 6.2  NEUTROABS  --  5.0 7.1 4.4 3.6  HGB 16.7 15.9 16.2 15.7 14.8  HCT 49.1 46.5 48.5 47.1 43.9  MCV 86.7 88.7 89.3 89.0 88.9  PLT 204 176 176 157 146*   Basic Metabolic Panel: Recent Labs  Lab 09/11/19 0417 09/12/19 0326 09/13/19 0015 09/14/19 0116 09/15/19 0116  NA 138 137 138 137 137  K 4.6 3.6 3.7 3.5 3.6  CL 99 98 98 99 100  CO2 25 28 29 24 27   GLUCOSE 191* 154* 141* 113* 121*  BUN 20 20 21* 19 17  CREATININE 0.87 0.96 0.86 0.78 0.69  CALCIUM 9.3 8.9 9.2 8.8* 8.8*  MG  --  2.2 2.4 2.3 2.2  PHOS  --  4.9* 4.4 4.3 4.6   GFR: Estimated Creatinine Clearance: 153.5 mL/min (by  C-G formula based on SCr of 0.69 mg/dL). Liver Function Tests: Recent Labs  Lab 09/09/19 0320 09/12/19 0326 09/13/19 0015 09/14/19 0116 09/15/19 0116  AST 30 28 21 23 22   ALT 58* 74* 70* 60* 57*  ALKPHOS 74 64 68 62 55  BILITOT 1.0 1.1 0.9 1.1 1.0  PROT 6.1* 6.0* 6.4* 6.4* 5.9*  ALBUMIN 3.7 3.6 3.9 3.9 3.6   No results for input(s): LIPASE, AMYLASE in the last 168 hours. No results for input(s): AMMONIA in the last 168 hours. Coagulation Profile: No results for input(s): INR, PROTIME in the last 168 hours. Cardiac Enzymes: No results for input(s): CKTOTAL, CKMB, CKMBINDEX, TROPONINI in the last 168 hours. BNP (last 3 results) No results for input(s): PROBNP in the last 8760 hours. HbA1C: No results for input(s): HGBA1C in the last 72 hours. CBG: Recent Labs  Lab 09/14/19 1133 09/14/19 1558 09/14/19 2052 09/15/19 0743 09/15/19 1224  GLUCAP 226* 160* 189* 139* 192*   Lipid Profile: No results for input(s): CHOL, HDL, LDLCALC, TRIG, CHOLHDL, LDLDIRECT in the last 72 hours. Thyroid Function Tests: No results for input(s): TSH, T4TOTAL, FREET4, T3FREE, THYROIDAB in the last 72 hours. Anemia Panel: Recent  Labs    09/14/19 0116 09/15/19 0116  FERRITIN 450* 356*   Sepsis Labs: No results for input(s): PROCALCITON, LATICACIDVEN in the last 168 hours.  No results found for this or any previous visit (from the past 240 hour(s)).       Radiology Studies: No results found.      Scheduled Meds: . vitamin C  500 mg Oral Daily  . [START ON 09/16/2019] enoxaparin (LOVENOX) injection  65 mg Subcutaneous Q24H  . fluticasone  2 spray Each Nare Daily  . insulin aspart  0-20 Units Subcutaneous TID WC  . insulin aspart  0-5 Units Subcutaneous QHS  . insulin aspart  4 Units Subcutaneous TID WC  . insulin glargine  8 Units Subcutaneous Daily  . levalbuterol  2 puff Inhalation Q6H  . linagliptin  5 mg Oral Daily  . mometasone-formoterol  2 puff Inhalation BID  . pantoprazole  40 mg Oral BID  . potassium chloride  20 mEq Oral Daily  . zinc sulfate  220 mg Oral Daily   Continuous Infusions:   LOS: 19 days    Time spent: over 30 min    09/18/2019, MD [facility] Pager AMION  If 7PM-7AM, please contact night-coverage [email or web address] Password [email or web address] 09/15/2019, 2:30 PM

## 2019-09-15 NOTE — Plan of Care (Signed)

## 2019-09-15 NOTE — Progress Notes (Signed)
Ddimer<5, ok to decrease Lovenox to qday per Dr Lowell Guitar.  Ulyses Southward, PharmD, BCIDP, AAHIVP, CPP Infectious Disease Pharmacist 09/15/2019 11:09 AM

## 2019-09-16 LAB — CBC WITH DIFFERENTIAL/PLATELET
Abs Immature Granulocytes: 0.09 10*3/uL — ABNORMAL HIGH (ref 0.00–0.07)
Basophils Absolute: 0.1 10*3/uL (ref 0.0–0.1)
Basophils Relative: 1 %
Eosinophils Absolute: 0.7 10*3/uL — ABNORMAL HIGH (ref 0.0–0.5)
Eosinophils Relative: 14 %
HCT: 43 % (ref 39.0–52.0)
Hemoglobin: 14.4 g/dL (ref 13.0–17.0)
Immature Granulocytes: 2 %
Lymphocytes Relative: 27 %
Lymphs Abs: 1.4 10*3/uL (ref 0.7–4.0)
MCH: 29.6 pg (ref 26.0–34.0)
MCHC: 33.5 g/dL (ref 30.0–36.0)
MCV: 88.5 fL (ref 80.0–100.0)
Monocytes Absolute: 0.5 10*3/uL (ref 0.1–1.0)
Monocytes Relative: 9 %
Neutro Abs: 2.4 10*3/uL (ref 1.7–7.7)
Neutrophils Relative %: 47 %
Platelets: 130 10*3/uL — ABNORMAL LOW (ref 150–400)
RBC: 4.86 MIL/uL (ref 4.22–5.81)
RDW: 14.3 % (ref 11.5–15.5)
WBC: 5 10*3/uL (ref 4.0–10.5)
nRBC: 0 % (ref 0.0–0.2)

## 2019-09-16 LAB — COMPREHENSIVE METABOLIC PANEL
ALT: 65 U/L — ABNORMAL HIGH (ref 0–44)
AST: 30 U/L (ref 15–41)
Albumin: 3.5 g/dL (ref 3.5–5.0)
Alkaline Phosphatase: 52 U/L (ref 38–126)
Anion gap: 8 (ref 5–15)
BUN: 13 mg/dL (ref 6–20)
CO2: 27 mmol/L (ref 22–32)
Calcium: 8.7 mg/dL — ABNORMAL LOW (ref 8.9–10.3)
Chloride: 101 mmol/L (ref 98–111)
Creatinine, Ser: 0.78 mg/dL (ref 0.61–1.24)
GFR calc Af Amer: >60 mL/min (ref >60)
GFR calc non Af Amer: >60 mL/min (ref >60)
Glucose, Bld: 125 mg/dL — ABNORMAL HIGH (ref 70–99)
Potassium: 3.6 mmol/L (ref 3.5–5.1)
Sodium: 136 mmol/L (ref 135–145)
Total Bilirubin: 1.1 mg/dL (ref 0.3–1.2)
Total Protein: 5.6 g/dL — ABNORMAL LOW (ref 6.5–8.1)

## 2019-09-16 LAB — MAGNESIUM: Magnesium: 2 mg/dL (ref 1.7–2.4)

## 2019-09-16 LAB — D-DIMER, QUANTITATIVE: D-Dimer, Quant: 0.65 ug/mL-FEU — ABNORMAL HIGH (ref 0.00–0.50)

## 2019-09-16 LAB — GLUCOSE, CAPILLARY
Glucose-Capillary: 147 mg/dL — ABNORMAL HIGH (ref 70–99)
Glucose-Capillary: 144 mg/dL — ABNORMAL HIGH (ref 70–99)
Glucose-Capillary: 179 mg/dL — ABNORMAL HIGH (ref 70–99)
Glucose-Capillary: 149 mg/dL — ABNORMAL HIGH (ref 70–99)
Glucose-Capillary: 138 mg/dL — ABNORMAL HIGH (ref 70–99)

## 2019-09-16 LAB — C-REACTIVE PROTEIN: CRP: 0.5 mg/dL (ref <1.0)

## 2019-09-16 LAB — FERRITIN: Ferritin: 349 ng/mL — ABNORMAL HIGH (ref 24–336)

## 2019-09-16 LAB — PHOSPHORUS: Phosphorus: 4.2 mg/dL (ref 2.5–4.6)

## 2019-09-16 NOTE — Progress Notes (Signed)
PROGRESS NOTE    Henry Miller  GXQ:[id number] DOB: 10/07/1970 DOA: 08/27/2019 PCP: Patient, No Pcp Per   Brief Narrative:  Henry Miller is an 49 y.o. male past medical history of prediabetes, DVT PE presents with Henry Miller 1 day history of worsening shortness of breath he relates he tested positive for SARS-CoV-2 on 08/26/2019.  Prior to this he had been having mild symptoms of nonproductive cough and Henry Miller low-grade fever accompanied by nausea vomiting diarrhea.  Checked his saturations at home and was 86% on room air, EMS placed him in 6 L and improved to 89 found tachycardic in the ED and satting 89% on 6 L we will start on remdesivir and steroids oxygen supplementation was increased to 15 L and saturations improved.  Actemra has been given  Assessment & Plan:   Principal Problem:   Pneumonia due to COVID-19 virus Active Problems:   Type 2 diabetes mellitus (HCC)   Morbid obesity with BMI of 40.0-44.9, adult (HCC)   Hypokalemia   Acute respiratory failure with hypoxia (HCC)   Uncontrolled diabetes mellitus with hyperglycemia, without long-term current use of insulin (HCC)  Acute Hypoxic Respiratory Failure 2/2 COVID 19 Pneumonia Weaned to 1 L today, improving CXR with worsening of bilateral heterogenous and interstitial airspace opacity S/p remdesivir Steroids being tapered S/p actemra x 2 Afebrile without WBC count, suspect persistent hypoxia related to COVID He's had CTA PE protocol x 2 which have been negative with LE [place] x1 negative I/O, daily weights - lasix as tolerated to maintain net negative to euvolemic (holding with soft pressures) Prone as able, IS, OOB Continue bid lovenox with severe illness  COVID-19 Labs  Recent Labs    09/14/19 0116 09/15/19 0116 09/16/19 0114  DDIMER 1.06* 0.79* 0.65*  FERRITIN 450* 356* 349*  CRP <0.5 0.6 0.5    Lab Results  Component Value Date   SARSCOV2NAA Detected (Henry Miller) 08/26/2019   Sinus Tachycardia: suspect related to COVID 19  infection and hypoxia/SOB.  As above negative VTE studies.   Echo with normal EF, diastolic dysfunction (see report) BP soft, no beta blocker for now Change inhalers to xopenex   Diabetes mellitus type 2, uncontrolled with hyperglycemia  HbA1c 9.9.  Patient was on Lantus and SSI.  However he developed Henry Miller few episodes of hypoglycemia so his Lantus was discontinued.  Subsequently CBGs started climbing again.  Lantus was reintroduced.  Continue current dose for now as the plan is to start tapering his steroids with which we anticipate his glucose levels to start dropping. Will start mealtime and follow with tradjenta as well.   Morbid obesity Estimated body mass index is 38.6 kg/m as calculated from the following:   Height as of this encounter: 5\' 10"  (1.778 m).   Weight as of this encounter: 122 kg.  Hypokalemia: Repleted.  Monitor.  Thrombocytopenia: follow closely, downtrending over last 2 days  DVT prophylaxis: lovenox Code Status: full  Family Communication: none at bedside - called wife 1/17 Disposition Plan: none at bedside   Consultants:   none  Procedures:  Echo IMPRESSIONS    1. Left ventricular ejection fraction, by visual estimation, is 60 to 65%. The left ventricle has normal function. There is no left ventricular hypertrophy.  2. Left ventricular diastolic parameters are consistent with Grade I diastolic dysfunction (impaired relaxation).  3. The left ventricle has no regional wall motion abnormalities.  4. Global right ventricle was not well visualized.The right ventricular size is not well visualized. Right vetricular wall  thickness was not assessed.  5. Left atrial size was normal.  6. Right atrial size was normal.  7. The mitral valve is normal in structure. No evidence of mitral valve regurgitation. No evidence of mitral stenosis.  8. The tricuspid valve is normal in structure.  9. The aortic valve was not well visualized. Aortic valve regurgitation is not  visualized. No evidence of aortic valve sclerosis or stenosis. 10. TR signal is inadequate for assessing pulmonary artery systolic pressure. 11. The inferior vena cava is normal in size with greater than 50% respiratory variability, suggesting right atrial pressure of 3 mmHg. 12. Technically difficult study with poor acoustic windows.  LE [place] Summary: Right: There is no evidence of deep vein thrombosis in the lower extremity. No cystic structure found in the popliteal fossa. Left: There is no evidence of deep vein thrombosis in the lower extremity. No cystic structure found in the popliteal fossa.   Antimicrobials:  Anti-infectives (From admission, onward)   Start     Dose/Rate Route Frequency Ordered Stop   08/29/19 1000  remdesivir 100 mg in sodium chloride 0.9 % 100 mL IVPB     100 mg 200 mL/hr over 30 Minutes Intravenous Daily 08/28/19 0042 09/01/19 1100   08/28/19 0300  remdesivir 200 mg in sodium chloride 0.9% 250 mL IVPB     200 mg 580 mL/hr over 30 Minutes Intravenous Once 08/28/19 0042 08/28/19 0413     Subjective: Continues to feel Henry Miller bit better  Objective: Vitals:   09/16/19 0510 09/16/19 0754 09/16/19 0800 09/16/19 1200  BP: 105/76  120/75 108/82  Pulse: (!) 108  (!) 114 (!) 111  Resp: (!) 21  18 (!) 22  Temp: 98.5 F (36.9 C) 98.8 F (37.1 C)  97.9 F (36.6 C)  TempSrc: Oral Oral  Oral  SpO2: 94%  93% 97%  Weight:      Height:        Intake/Output Summary (Last 24 hours) at 09/16/2019 1525 Last data filed at 09/16/2019 0600 Gross per 24 hour  Intake --  Output 950 ml  Net -950 ml   Filed Weights   09/14/19 0500 09/15/19 0419 09/16/19 0500  Weight: 130.8 kg 130.8 kg 121.1 kg    Examination:  General: No acute distress. Cardiovascular: Heart sounds show Henry Miller regular rate, and rhythm.  Lungs: Clear to auscultation bilaterally  Abdomen: Soft, nontender, nondistended  Neurological: Alert and oriented 3. Moves all extremities 4. Cranial nerves II through  XII grossly intact. Skin: Warm and dry. No rashes or lesions. Extremities: No clubbing or cyanosis. No edema.   Data Reviewed: I have personally reviewed following labs and imaging studies  CBC: Recent Labs  Lab 09/12/19 0326 09/13/19 0015 09/14/19 0116 09/15/19 0116 09/16/19 0114  WBC 7.3 9.2 7.1 6.2 5.0  NEUTROABS 5.0 7.1 4.4 3.6 2.4  HGB 15.9 16.2 15.7 14.8 14.4  HCT 46.5 48.5 47.1 43.9 43.0  MCV 88.7 89.3 89.0 88.9 88.5  PLT 176 176 157 146* 601*   Basic Metabolic Panel: Recent Labs  Lab 09/12/19 0326 09/13/19 0015 09/14/19 0116 09/15/19 0116 09/16/19 0114  NA 137 138 137 137 136  K 3.6 3.7 3.5 3.6 3.6  CL 98 98 99 100 101  CO2 28 29 24 27 27   GLUCOSE 154* 141* 113* 121* 125*  BUN 20 21* 19 17 13   CREATININE 0.96 0.86 0.78 0.69 0.78  CALCIUM 8.9 9.2 8.8* 8.8* 8.7*  MG 2.2 2.4 2.3 2.2 2.0  PHOS 4.9* 4.4  4.3 4.6 4.2   GFR: Estimated Creatinine Clearance: 147.3 mL/min (by C-G formula based on SCr of 0.78 mg/dL). Liver Function Tests: Recent Labs  Lab 09/12/19 0326 09/13/19 0015 09/14/19 0116 09/15/19 0116 09/16/19 0114  AST 28 21 23 22 30   ALT 74* 70* 60* 57* 65*  ALKPHOS 64 68 62 55 52  BILITOT 1.1 0.9 1.1 1.0 1.1  PROT 6.0* 6.4* 6.4* 5.9* 5.6*  ALBUMIN 3.6 3.9 3.9 3.6 3.5   No results for input(s): LIPASE, AMYLASE in the last 168 hours. No results for input(s): AMMONIA in the last 168 hours. Coagulation Profile: No results for input(s): INR, PROTIME in the last 168 hours. Cardiac Enzymes: No results for input(s): CKTOTAL, CKMB, CKMBINDEX, TROPONINI in the last 168 hours. BNP (last 3 results) No results for input(s): PROBNP in the last 8760 hours. HbA1C: No results for input(s): HGBA1C in the last 72 hours. CBG: Recent Labs  Lab 09/15/19 1224 09/15/19 1700 09/15/19 2030 09/16/19 0751 09/16/19 1126  GLUCAP 192* 169* 122* 144* 179*   Lipid Profile: No results for input(s): CHOL, HDL, LDLCALC, TRIG, CHOLHDL, LDLDIRECT in the last 72  hours. Thyroid Function Tests: No results for input(s): TSH, T4TOTAL, FREET4, T3FREE, THYROIDAB in the last 72 hours. Anemia Panel: Recent Labs    09/15/19 0116 09/16/19 0114  FERRITIN 356* 349*   Sepsis Labs: No results for input(s): PROCALCITON, LATICACIDVEN in the last 168 hours.  No results found for this or any previous visit (from the past 240 hour(s)).       Radiology Studies: No results found.      Scheduled Meds: . vitamin C  500 mg Oral Daily  . enoxaparin (LOVENOX) injection  65 mg Subcutaneous Q24H  . fluticasone  2 spray Each Nare Daily  . insulin aspart  0-20 Units Subcutaneous TID WC  . insulin aspart  0-5 Units Subcutaneous QHS  . insulin aspart  4 Units Subcutaneous TID WC  . insulin glargine  8 Units Subcutaneous Daily  . levalbuterol  2 puff Inhalation Q6H  . linagliptin  5 mg Oral Daily  . mometasone-formoterol  2 puff Inhalation BID  . pantoprazole  40 mg Oral BID  . potassium chloride  20 mEq Oral Daily  . zinc sulfate  220 mg Oral Daily   Continuous Infusions:   LOS: 20 days    Time spent: over 30 min    09/18/19, MD [facility] Pager AMION  If 7PM-7AM, please contact night-coverage [email or web address] Password [facility] 09/16/2019, 3:25 PM

## 2019-09-16 NOTE — Progress Notes (Signed)
Updated patient's spouse today with plan of care and situation.  All questions answered.

## 2019-09-16 NOTE — Progress Notes (Signed)
Physical Therapy Treatment Patient Details Name: Henry Miller MRN: [id number] DOB: 28-May-1971 Today's Date: 09/16/2019    History of Present Illness 49 y.o. male admitted on 08/27/19 for acute respiratory failure with hypoxia due to COVID 19 viral PNA, hypokalemia.  Pt with significant PMH of DM2, morbid obesity.     PT Comments    Pt continues to make good progress. Gave theraband and exercise program to perform on his own.    Follow Up Recommendations  No PT follow up     Equipment Recommendations  None recommended by PT    Recommendations for Other Services       Precautions / Restrictions Precautions Precautions: Other (comment) Precaution Comments: 02 and HR w/ activity Restrictions Weight Bearing Restrictions: No    Mobility  Bed Mobility               General bed mobility comments: Pt up in recliner  Transfers Overall transfer level: Modified independent Equipment used: None Transfers: Sit to/from [employer] Sit to Stand: Modified independent (Device/Increase time)            Ambulation/Gait Ambulation/Gait assistance: Supervision Gait Distance (Feet): 300 Feet Assistive device: None Gait Pattern/deviations: Step-through pattern Gait velocity: decreased Gait velocity interpretation: 1.31 - 2.62 ft/sec, indicative of limited community ambulator General Gait Details: supervision for lines and monitoring of O2 needs   Stairs             Wheelchair Mobility    Modified Rankin (Stroke Patients Only)       Balance Overall balance assessment: No apparent balance deficits (not formally assessed)                                          Cognition Arousal/Alertness: Awake/alert Behavior During Therapy: WFL for tasks assessed/performed Overall Cognitive Status: Within Functional Limits for tasks assessed                                        Exercises      General Comments  General comments (skin integrity, edema, etc.): pt on 1L at rest. Amb on 4L with SpO2 decr to 84-85%. Recovered to >90% after sitting rest break of 1 minute      Pertinent Vitals/Pain Pain Assessment: No/denies pain    Home Living                      Prior Function            PT Goals (current goals can now be found in the care plan section) Acute Rehab PT Goals Patient Stated Goal: feels much better and feels like he's improving PT Goal Formulation: With patient Time For Goal Achievement: 09/23/19 Potential to Achieve Goals: Good Progress towards PT goals: Progressing toward goals    Frequency    Min 3X/week      PT Plan Current plan remains appropriate    Co-evaluation              AM-PAC PT "6 Clicks" Mobility   Outcome Measure  Help needed turning from your back to your side while in a flat bed without using bedrails?: None Help needed moving from lying on your back to sitting on the side of a flat bed without using bedrails?: None  Help needed moving to and from a bed to a chair (including a wheelchair)?: None Help needed standing up from a chair using your arms (e.g., wheelchair or bedside chair)?: None Help needed to walk in hospital room?: A Little Help needed climbing 3-5 steps with a railing? : A Little 6 Click Score: 22    End of Session Equipment Utilized During Treatment: Oxygen Activity Tolerance: Patient tolerated treatment well Patient left: in chair;with call bell/phone within reach   PT Visit Diagnosis: Difficulty in walking, not elsewhere classified (R26.2)     Time: 1430-1450 PT Time Calculation (min) (ACUTE ONLY): 20 min  Charges:  $Gait Training: 8-22 mins                     [place] Pager [phone] Office [place] 09/16/2019, 4:01 PM

## 2019-09-17 LAB — MAGNESIUM: Magnesium: 2.1 mg/dL (ref 1.7–2.4)

## 2019-09-17 LAB — CBC WITH DIFFERENTIAL/PLATELET
Abs Immature Granulocytes: 0.08 10*3/uL — ABNORMAL HIGH (ref 0.00–0.07)
Basophils Absolute: 0 10*3/uL (ref 0.0–0.1)
Basophils Relative: 1 %
Eosinophils Absolute: 0.7 10*3/uL — ABNORMAL HIGH (ref 0.0–0.5)
Eosinophils Relative: 15 %
HCT: 41.5 % (ref 39.0–52.0)
Hemoglobin: 14.5 g/dL (ref 13.0–17.0)
Immature Granulocytes: 2 %
Lymphocytes Relative: 26 %
Lymphs Abs: 1.2 10*3/uL (ref 0.7–4.0)
MCH: 31 pg (ref 26.0–34.0)
MCHC: 34.9 g/dL (ref 30.0–36.0)
MCV: 88.9 fL (ref 80.0–100.0)
Monocytes Absolute: 0.4 10*3/uL (ref 0.1–1.0)
Monocytes Relative: 10 %
Neutro Abs: 2.1 10*3/uL (ref 1.7–7.7)
Neutrophils Relative %: 46 %
Platelets: 121 10*3/uL — ABNORMAL LOW (ref 150–400)
RBC: 4.67 MIL/uL (ref 4.22–5.81)
RDW: 14.7 % (ref 11.5–15.5)
WBC: 4.5 10*3/uL (ref 4.0–10.5)
nRBC: 0 % (ref 0.0–0.2)

## 2019-09-17 LAB — GLUCOSE, CAPILLARY
Glucose-Capillary: 136 mg/dL — ABNORMAL HIGH (ref 70–99)
Glucose-Capillary: 144 mg/dL — ABNORMAL HIGH (ref 70–99)
Glucose-Capillary: 116 mg/dL — ABNORMAL HIGH (ref 70–99)

## 2019-09-17 LAB — COMPREHENSIVE METABOLIC PANEL
ALT: 71 U/L — ABNORMAL HIGH (ref 0–44)
AST: 33 U/L (ref 15–41)
Albumin: 3.3 g/dL — ABNORMAL LOW (ref 3.5–5.0)
Alkaline Phosphatase: 52 U/L (ref 38–126)
Anion gap: 9 (ref 5–15)
BUN: 10 mg/dL (ref 6–20)
CO2: 24 mmol/L (ref 22–32)
Calcium: 8.5 mg/dL — ABNORMAL LOW (ref 8.9–10.3)
Chloride: 105 mmol/L (ref 98–111)
Creatinine, Ser: 0.7 mg/dL (ref 0.61–1.24)
GFR calc Af Amer: >60 mL/min (ref >60)
GFR calc non Af Amer: >60 mL/min (ref >60)
Glucose, Bld: 124 mg/dL — ABNORMAL HIGH (ref 70–99)
Potassium: 3.3 mmol/L — ABNORMAL LOW (ref 3.5–5.1)
Sodium: 138 mmol/L (ref 135–145)
Total Bilirubin: 0.7 mg/dL (ref 0.3–1.2)
Total Protein: 5.5 g/dL — ABNORMAL LOW (ref 6.5–8.1)

## 2019-09-17 LAB — C-REACTIVE PROTEIN: CRP: 0.6 mg/dL (ref <1.0)

## 2019-09-17 LAB — FERRITIN: Ferritin: 330 ng/mL (ref 24–336)

## 2019-09-17 LAB — D-DIMER, QUANTITATIVE: D-Dimer, Quant: 0.54 ug/mL-FEU — ABNORMAL HIGH (ref 0.00–0.50)

## 2019-09-17 LAB — PHOSPHORUS: Phosphorus: 4.3 mg/dL (ref 2.5–4.6)

## 2019-09-17 MED ORDER — MAGIC MOUTHWASH W/LIDOCAINE
5.0000 mL | Freq: Four times a day (QID) | ORAL | Status: DC | PRN
  Administered 2019-09-17 (×2): 5 mL via ORAL
  Filled 2019-09-17 (×3): qty 5

## 2019-09-17 MED ORDER — BLOOD GLUCOSE MONITOR KIT: PACK | 0 refills | Status: DC

## 2019-09-17 MED ORDER — METFORMIN HCL 500 MG PO TABS: 500.0000 mg | ORAL_TABLET | Freq: Two times a day (BID) | ORAL | 0 refills | Status: DC

## 2019-09-17 MED ORDER — LIVING WELL WITH DIABETES BOOK
Freq: Once | Status: AC
  Filled 2019-09-17: qty 1

## 2019-09-17 MED ORDER — ENOXAPARIN SODIUM 60 MG/0.6ML ~~LOC~~ SOLN: 60.0000 mg | SUBCUTANEOUS | Status: DC

## 2019-09-17 MED ORDER — LANTUS SOLOSTAR 100 UNIT/ML ~~LOC~~ SOPN: 8.0000 [IU] | PEN_INJECTOR | Freq: Every day | SUBCUTANEOUS | 0 refills | Status: DC

## 2019-09-17 MED ORDER — LINAGLIPTIN 5 MG PO TABS: 5.0000 mg | ORAL_TABLET | Freq: Every day | ORAL | 0 refills | Status: DC

## 2019-09-17 NOTE — Progress Notes (Signed)
Inpatient Diabetes Program Recommendations  AACE/ADA: New Consensus Statement on Inpatient Glycemic Control (2015)  Target Ranges:  Prepandial:   less than 140 mg/dL      Peak postprandial:   less than 180 mg/dL (1-2 hours)      Critically ill patients:  140 - 180 mg/dL   Lab Results  Component Value Date   GLUCAP 144 (H) 09/17/2019   HGBA1C 9.9 (H) 08/29/2019    Inpatient Diabetes Program Recommendations:    Spoke with patient via phone Spoke with pt about new diagnosis. Discussed A1C results with them 9.9 (average blood glucose 237 over the past 2-3 months) and explained what an A1C is, basic pathophysiology of DM Type 2, basic home care, basic diabetes diet nutrition principles, importance of checking CBGs and maintaining good CBG control to prevent long-term and short-term complications. Reviewed signs and symptoms of hyperglycemia and hypoglycemia and how to treat hypoglycemia at home. Also reviewed blood sugar goals at home.  Ordered Living Well With Diabetes and patient wants to call [facility] for DM education after he goes home. Patient states he has lost approx.50 lbs over the past few months and wants to continue to watch his nutrition. Reviewed basic plate method and limiting sugary drinks and carbohydrates. Patient has been eating larger quantities of breads,crackers and chips. Discussed with Dr. Lowell Guitar diabetes management and agree patient may be able to go home on Metformin and Trajenta and followup with PCP.  Thank you, Henry Miller. Henry Bromwell, RN, MSN, CDE  Diabetes Coordinator Inpatient Glycemic Control Team Team Pager [phone] (8am-5pm) 09/17/2019 3:58 PM

## 2019-09-17 NOTE — Progress Notes (Addendum)
Reviewed discharge instructions with patient.  All questions answered.  Home oxygen to be delivered to pts house.  Wife to pick up patient once oxygen delivered.  All belongs at beside with patient.

## 2019-09-17 NOTE — TOC Transition Note (Signed)
Transition of Care [facility]) - CM/SW Discharge Note Donn Pierini RN, BSN Transitions of Care Unit 4E- RN Case Manager [place] Coverage) [phone]   Patient Details  Name: Henry Miller MRN: [id number] Date of Birth: [birth year]  [facility] CM/SW Contact:  Darrold Span, RN Phone Number: 09/17/2019, 2:36 PM   Clinical Narrative:    Pt stable for transition home today, order placed for Home 02 needs- call made to patient's spouse to discuss DME needs for home- address and contact # confirmed with spouse in epic- per spouse- pt's PCP retired and he has not selected a new one since. CM will try to reach out to clinics for f/u tele visit. Per wife they do not have a preference for DME agency- and she plans to transport pt home via private car. Call made to [facility] with Christoper Allegra for home 02 needs however they are out of home concentrators for today. Call made to [facility] with [facility] who states they have available concentrators for home delivery today and portable tanks- they will deliver a portable tank to [facility] for pt to transport home with and call wife to make arrangements for home equipment delivery. Once Home equipment has been confirmed delivered to the home pt may transport home with portable tank. Wife reports that they have thermometer and pulse ox available at home.    Final next level of care: Home/Self Care Barriers to Discharge: No Barriers Identified   Patient Goals and CMS Choice Patient states their goals for this hospitalization and ongoing recovery are:: return home(per wife) CMS Medicare.gov Compare Post Acute Care list provided to:: Patient Represenative (must comment) Choice offered to / list presented to : Spouse  Discharge Placement               Home         Discharge Plan and Services   Discharge Planning Services: CM Consult Post Acute Care Choice: Durable Medical Equipment          DME Arranged: Oxygen DME Agency:  Other - Comment Date DME Agency Contacted: 09/17/19 Time DME Agency Contacted: 1400 Representative spoke with at DME Agency: Tresa Endo HH Arranged: NA HH Agency: NA        Social Determinants of Health (SDOH) Interventions     Readmission Risk Interventions Readmission Risk Prevention Plan 09/17/2019  Transportation Screening Complete  PCP or Specialist Appt within 5-7 Days Complete  Home Care Screening Complete  Medication Review (RN CM) Complete  Some recent data might be hidden

## 2019-09-17 NOTE — Progress Notes (Signed)
Ambulation Note  Saturation Pre: 93% on RA  Ambulation Distance: 400 ft  Saturation During Ambulation: 91-94% on 2L  Notes: Pt walked independently tolerated well. Pt walked about 100 ft on RA before sats dropped to 82%. Sats recovered into the 90s quickly on 2 L and sats were maintained through the rest of the walk. Pt returned to recliner with call bell within reach, sats were 95% on RA.  Alisia Ferrari, MS, ACSM CEP 9:56 AM 09/17/2019

## 2019-09-17 NOTE — Progress Notes (Signed)
Patient D/C to home and accompany by his spouse. Patient was alert and responsive with no distress noted prior to leaving the floor.

## 2019-09-17 NOTE — Discharge Instructions (Signed)
Person Under Monitoring Name: Henry Miller  Location: [address] [place] [address]   Infection Prevention Recommendations for Individuals Confirmed to have, or Being Evaluated for, 2019 Novel Coronavirus (COVID-19) Infection Who Receive Care at Home  Individuals who are confirmed to have, or are being evaluated for, COVID-19 should follow the prevention steps below until a healthcare provider or local or state health department says they can return to normal activities.  Stay home except to get medical care You should restrict activities outside your home, except for getting medical care. Do not go to work, school, or public areas, and do not use public transportation or taxis.  Call ahead before visiting your doctor Before your medical appointment, call the healthcare provider and tell them that you have, or are being evaluated for, COVID-19 infection. This will help the healthcare provider's office take steps to keep other people from getting infected. Ask your healthcare provider to call the local or state health department.  Monitor your symptoms Seek prompt medical attention if your illness is worsening (e.g., difficulty breathing). Before going to your medical appointment, call the healthcare provider and tell them that you have, or are being evaluated for, COVID-19 infection. Ask your healthcare provider to call the local or state health department.  Wear a facemask You should wear a facemask that covers your nose and mouth when you are in the same room with other people and when you visit a healthcare provider. People who live with or visit you should also wear a facemask while they are in the same room with you.  Separate yourself from other people in your home As much as possible, you should stay in a different room from other people in your home. Also, you should use a separate bathroom, if available.  Avoid sharing household items You should not share  dishes, drinking glasses, cups, eating utensils, towels, bedding, or other items with other people in your home. After using these items, you should wash them thoroughly with soap and water.  Cover your coughs and sneezes Cover your mouth and nose with a tissue when you cough or sneeze, or you can cough or sneeze into your sleeve. Throw used tissues in a lined trash can, and immediately wash your hands with soap and water for at least 20 seconds or use an alcohol-based hand rub.  Wash your [employer] your hands often and thoroughly with soap and water for at least 20 seconds. You can use an alcohol-based hand sanitizer if soap and water are not available and if your hands are not visibly dirty. Avoid touching your eyes, nose, and mouth with unwashed hands.   Prevention Steps for Caregivers and Household Members of Individuals Confirmed to have, or Being Evaluated for, COVID-19 Infection Being Cared for in the Home  If you live with, or provide care at home for, a person confirmed to have, or being evaluated for, COVID-19 infection please follow these guidelines to prevent infection:  Follow healthcare provider's instructions Make sure that you understand and can help the patient follow any healthcare provider instructions for all care.  Provide for the patient's basic needs You should help the patient with basic needs in the home and provide support for getting groceries, prescriptions, and other personal needs.  Monitor the patient's symptoms If they are getting sicker, call his or her medical provider and tell them that the patient has, or is being evaluated for, COVID-19 infection. This will help the healthcare provider's office  take steps to keep other people from getting infected. Ask the healthcare provider to call the local or state health department.  Limit the number of people who have contact with the patient  If possible, have only one caregiver for the patient.  Other  household members should stay in another home or place of residence. If this is not possible, they should stay  in another room, or be separated from the patient as much as possible. Use a separate bathroom, if available.  Restrict visitors who do not have an essential need to be in the home.  Keep older adults, very young children, and other sick people away from the patient Keep older adults, very young children, and those who have compromised immune systems or chronic health conditions away from the patient. This includes people with chronic heart, lung, or kidney conditions, diabetes, and cancer.  Ensure good ventilation Make sure that shared spaces in the home have good air flow, such as from an air conditioner or an opened window, weather permitting.  Wash your hands often  Wash your hands often and thoroughly with soap and water for at least 20 seconds. You can use an alcohol based hand sanitizer if soap and water are not available and if your hands are not visibly dirty.  Avoid touching your eyes, nose, and mouth with unwashed hands.  Use disposable paper towels to dry your hands. If not available, use dedicated cloth towels and replace them when they become wet.  Wear a facemask and gloves  Wear a disposable facemask at all times in the room and gloves when you touch or have contact with the patient's blood, body fluids, and/or secretions or excretions, such as sweat, saliva, sputum, nasal mucus, vomit, urine, or feces.  Ensure the mask fits over your nose and mouth tightly, and do not touch it during use.  Throw out disposable facemasks and gloves after using them. Do not reuse.  Wash your hands immediately after removing your facemask and gloves.  If your personal clothing becomes contaminated, carefully remove clothing and launder. Wash your hands after handling contaminated clothing.  Place all used disposable facemasks, gloves, and other waste in a lined container before  disposing them with other household waste.  Remove gloves and wash your hands immediately after handling these items.  Do not share dishes, glasses, or other household items with the patient  Avoid sharing household items. You should not share dishes, drinking glasses, cups, eating utensils, towels, bedding, or other items with a patient who is confirmed to have, or being evaluated for, COVID-19 infection.  After the person uses these items, you should wash them thoroughly with soap and water.  Wash laundry thoroughly  Immediately remove and wash clothes or bedding that have blood, body fluids, and/or secretions or excretions, such as sweat, saliva, sputum, nasal mucus, vomit, urine, or feces, on them.  Wear gloves when handling laundry from the patient.  Read and follow directions on labels of laundry or clothing items and detergent. In general, wash and dry with the warmest temperatures recommended on the label.  Clean all areas the individual has used often  Clean all touchable surfaces, such as counters, tabletops, doorknobs, bathroom fixtures, toilets, phones, keyboards, tablets, and bedside tables, every day. Also, clean any surfaces that may have blood, body fluids, and/or secretions or excretions on them.  Wear gloves when cleaning surfaces the patient has come in contact with.  Use a diluted bleach solution (e.g., dilute bleach with 1 part  bleach and 10 parts water) or a household disinfectant with a label that says EPA-registered for coronaviruses. To make a bleach solution at home, add 1 tablespoon of bleach to 1 quart (4 cups) of water. For a larger supply, add  cup of bleach to 1 gallon (16 cups) of water.  Read labels of cleaning products and follow recommendations provided on product labels. Labels contain instructions for safe and effective use of the cleaning product including precautions you should take when applying the product, such as wearing gloves or eye protection  and making sure you have good ventilation during use of the product.  Remove gloves and wash hands immediately after cleaning.  Monitor yourself for signs and symptoms of illness Caregivers and household members are considered close contacts, should monitor their health, and will be asked to limit movement outside of the home to the extent possible. Follow the monitoring steps for close contacts listed on the symptom monitoring form.   ? If you have additional questions, contact your local health department or call the epidemiologist on call at [phone] (available 24/7). ? This guidance is subject to change. For the most up-to-date guidance from [facility], please refer to their website: [email or web address]

## 2019-09-17 NOTE — Progress Notes (Signed)
SATURATION QUALIFICATIONS: (This note is used to comply with regulatory documentation for home oxygen)  Patient Saturations on Room Air at Rest = 93%  Patient Saturations on Room Air while Ambulating = 82%  Patient Saturations on 2 Liters of oxygen while Ambulating = 91%  Please briefly explain why patient needs home oxygen: Pt needs supplemental O2 to maintain saturations at 88% and above while ambulating.

## 2019-09-17 NOTE — Discharge Summary (Signed)
Physician Discharge Summary  Henry Miller QRF:[id number] DOB: Jul 23, 1971 DOA: 08/27/2019  PCP: Patient, No Pcp Per  Admit date: 08/27/2019 Discharge date: 09/17/2019  Time spent: 40 minutes  Recommendations for Outpatient Follow-up:  1. Follow outpatient CBC/CMP 2. Follow platelets outpatient, consider further w/u if downtrending 3. Follow diabetes outpatient - prescribed metformin and tradjenta at discharge  4. Discharged on home o2, follow long term O2 needs 5. Follow tachycardia outpatient 6. Follow with pulmonology outpatient   Discharge Diagnoses:  Principal Problem:   Pneumonia due to COVID-19 virus Active Problems:   Type 2 diabetes mellitus (Henry Miller)   Morbid obesity with BMI of 40.0-44.9, adult (Henry Miller)   Hypokalemia   Acute respiratory failure with hypoxia ([place])   Uncontrolled diabetes mellitus with hyperglycemia, without long-term current use of insulin (Henry Miller)   Discharge Condition: stable  Diet recommendation: heart healthy, diabetic  Filed Weights   09/14/19 0500 09/15/19 0419 09/16/19 0500  Weight: 130.8 kg 130.8 kg 121.1 kg    History of present illness:  Henry Miller an 48 y.o.malepast medical history of prediabetes, DVT PE presents with Henry Miller 1 day history of worsening shortness of breath he relates he tested positive for SARS-CoV-2 on 08/26/2019. Prior to this he had been having mild symptoms of nonproductive cough and Henry Miller low-grade fever accompanied by nausea vomiting diarrhea. Checked his saturations at home and was 86% on room air, EMS placed him in 6 L and improved to 89 found tachycardic in the ED and satting 89% on 6 L we will start on remdesivir and steroids oxygen supplementation was increased to 15 L and saturations improved. Actemra has been given  He was admitted for covid 19 pneumonia.  He's improved after extended course with steroids, actemra, and remdesivir.  Discharged on supplemental oxygen on 1/19.   See below for additional  details  Hospital Course:  Acute Hypoxic Respiratory Failure 2/2 COVID 19 Pneumonia Needs 2 L with activity, ok with RA at rest CXR with worsening of bilateral heterogenous and interstitial airspace opacity S/p remdesivir Steroids being tapered S/p actemra x 2 Afebrile without WBC count, suspect persistent hypoxia related to COVID He's had CTA PE protocol x 2 which have been negative with LE [place] x1 negative I/O, daily weights - lasix as tolerated to maintain net negative to euvolemic (holding with soft pressures) Prone as able, IS, OOB Follow with pulm outpatient   COVID-19 Labs  Recent Labs    09/15/19 0116 09/16/19 0114 09/17/19 0507  DDIMER 0.79* 0.65* 0.54*  FERRITIN 356* 349* 330  CRP 0.6 0.5 0.6    Lab Results  Component Value Date   SARSCOV2NAA Detected (Henry Miller) 08/26/2019   Sinus Tachycardia: suspect related to COVID 19 infection and hypoxia/SOB.  As above negative VTE studies.   Echo with normal EF, diastolic dysfunction (see report) BP soft, no beta blocker for now Likely 2/2 COVID and respiratory status, follow outpatient, consider cards f/u if persistent  Diabetes mellitus type 2, uncontrolled with hyperglycemia  HbA1c 9.9. Will discharge on tradjenta and metformin.   Diabetic educators saw him today.  Morbid obesity Estimated body mass index is 38.6 kg/m as calculated from the following: Height as of this encounter: '5\' 10"'$  (1.778 m). Weight as of this encounter: 122 kg.  Hypokalemia: Repleted. Monitor.  Thrombocytopenia: follow outpatient  Procedures: Echo IMPRESSIONS    1. Left ventricular ejection fraction, by visual estimation, is 60 to 65%. The left ventricle has normal function. There is no left ventricular hypertrophy.  2. Left  ventricular diastolic parameters are consistent with Grade I diastolic dysfunction (impaired relaxation).  3. The left ventricle has no regional wall motion abnormalities.  4. Global right ventricle was not  well visualized.The right ventricular size is not well visualized. Right vetricular wall thickness was not assessed.  5. Left atrial size was normal.  6. Right atrial size was normal.  7. The mitral valve is normal in structure. No evidence of mitral valve regurgitation. No evidence of mitral stenosis.  8. The tricuspid valve is normal in structure.  9. The aortic valve was not well visualized. Aortic valve regurgitation is not visualized. No evidence of aortic valve sclerosis or stenosis. 10. TR signal is inadequate for assessing pulmonary artery systolic pressure. 11. The inferior vena cava is normal in size with greater than 50% respiratory variability, suggesting right atrial pressure of 3 mmHg. 12. Technically difficult study with poor acoustic windows.  LE [place] Summary: Right: There is no evidence of deep vein thrombosis in the lower extremity. No cystic structure found in the popliteal fossa. Left: There is no evidence of deep vein thrombosis in the lower extremity. No cystic structure found in the popliteal fossa.  Consultations:  none  Discharge Exam: Vitals:   09/17/19 1700 09/17/19 1800  BP:    Pulse: (!) 118 (!) 118  Resp: (!) 23 (!) 22  Temp:    SpO2: 94% 96%   Feels well, ready for d/c Discussed d/c recs with pt and wife  General: No acute distress. Cardiovascular: Heart sounds show Henry Miller regular rate, and rhythm.  Lungs: Clear to auscultation bilaterally Abdomen: Soft, nontender, nondistended Neurological: Alert and oriented 3. Moves all extremities 4 . Cranial nerves II through XII grossly intact. Skin: Warm and dry. No rashes or lesions. Extremities: No clubbing or cyanosis. No edema. Discharge Instructions   Discharge Instructions    Ambulatory referral to Pulmonology   Complete by: As directed    COVID 19 follow up   Call MD for:  difficulty breathing, headache or visual disturbances   Complete by: As directed    Call MD for:  extreme fatigue   Complete  by: As directed    Call MD for:  hives   Complete by: As directed    Call MD for:  persistant dizziness or light-headedness   Complete by: As directed    Call MD for:  persistant nausea and vomiting   Complete by: As directed    Call MD for:  redness, tenderness, or signs of infection (pain, swelling, redness, odor or green/yellow discharge around incision site)   Complete by: As directed    Call MD for:  severe uncontrolled pain   Complete by: As directed    Call MD for:  temperature >100.4   Complete by: As directed    Diet - low sodium heart healthy   Complete by: As directed    Diet - low sodium heart healthy   Complete by: As directed    Discharge instructions   Complete by: As directed    You were seen for COVID 19 pneumonia.  You've improved with steroids, remdesivir, actemra.  You'll go home with oxygen, use 2 L with activity and at night.   Your heart rate continues to be fast.  Please follow up with your PCP next week.  You may need cardiology follow up at some point.  You have diabetes.  We'll send you home with tradjenta and metformin.  Please follow up with your PCP regarding your need for  insulin.    Return for new, recurrent, or worsening symptoms.  Please ask your PCP to request records from this hospitalization so they know what was done and what the next steps will be.   Increase activity slowly   Complete by: As directed    Increase activity slowly   Complete by: As directed      Allergies as of 09/17/2019   No Known Allergies     Medication List    STOP taking these medications   Advil PM 200-38 MG Tabs Generic drug: Ibuprofen-diphenhydrAMINE Cit     TAKE these medications   bismuth subsalicylate 030 MG chewable tablet Commonly known as: PEPTO BISMOL Chew 524 mg by mouth once as needed for diarrhea or loose stools.   blood glucose meter kit and supplies Kit Dispense based on patient and insurance preference. Use up to four times daily as  directed. (FOR ICD-9 250.00, 250.01).   linagliptin 5 MG Tabs tablet Commonly known as: TRADJENTA Take 1 tablet (5 mg total) by mouth daily. Start taking on: September 18, 2019   metFORMIN 500 MG tablet Commonly known as: Glucophage Take 1 tablet (500 mg total) by mouth 2 (two) times daily with Seila Liston meal.            Durable Medical Equipment  (From admission, onward)         Start     Ordered   09/17/19 1304  DME Oxygen  Once    Comments: SATURATION QUALIFICATIONS: (This note is used to comply with regulatory documentation for home oxygen)  Patient Saturations on Room Air at Rest = 93%  Patient Saturations on Room Air while Ambulating = 82%  Patient Saturations on 2 Liters of oxygen while Ambulating = 91%  Please briefly explain why patient needs home oxygen: Pt needs supplemental O2 to maintain saturations at 88% and above while ambulating.  Question Answer Comment  Length of Need 12 Months   Mode or (Route) Nasal cannula   Liters per Minute 2   Frequency Continuous (stationary and portable oxygen unit needed)   Oxygen delivery system Gas      09/17/19 1305         No Known Allergies Follow-up Information    [facility] Follow up.   Why: Home 02 arranged- portable tank to be delivered to [facility] for transport home- (someone will reach out to family regarding delivery of equipment to home- once home 02 delivery to home has been confirmed pt may discharge with portable tank) Contact information: [address] [place] [address] [phone]        Primary Care Doctor. Schedule an appointment as soon as possible for Henry Miller visit in 1 week(s).   Contact information: [facility]- [phone]- physician referral line to assist in finding Henry Miller Primary doctor       [facility] Follow up on 09/23/2019.   Why: at 9:50am for hospital followup This will be Henry Miller telephone visit.  Contact information: [place] [facility] [phone]           The results of significant diagnostics from this hospitalization (including imaging, microbiology, ancillary and laboratory) are listed below for reference.    Significant Diagnostic Studies: CT ANGIO CHEST PE W OR WO CONTRAST  Result Date: 09/05/2019 CLINICAL DATA:  49 year old male with Deyja Sochacki history shortness of breath, admission for COVID EXAM: CT ANGIOGRAPHY CHEST WITH CONTRAST TECHNIQUE: Multidetector CT imaging of the  chest was performed using the standard protocol during bolus administration of intravenous contrast. Multiplanar CT image reconstructions and MIPs were obtained to evaluate the vascular anatomy. CONTRAST:  47m OMNIPAQUE IOHEXOL 350 MG/ML SOLN COMPARISON:  08/27/2019 FINDINGS: Cardiovascular: Heart: No cardiomegaly. No pericardial fluid/thickening. Calcifications of the left anterior descending coronary artery. Aorta: Unremarkable course, caliber, contour of the thoracic aorta. No aneurysm or dissection flap. No periaortic fluid. Pulmonary arteries: No central filling defects of the main pulmonary artery, lobar arteries, segmental arteries. Beyond the segmental arteries the study is limited. Mediastinum/Nodes: Mediastinal lymphadenopathy, with enlarged lymph nodes throughout the nodal stations. Largest nodes in the subcarinal nodal station, with the index node measuring 16 mm. Bilateral hilar adenopathy. Unremarkable appearance of the thoracic esophagus. Hiatal hernia. Lungs/Pleura: Redemonstration of diffuse ground-glass opacities of the bilateral lungs, which has progressed from the comparison CT. All lobes are involved, with Phillips Goulette severity gradient from superior to inferior. There is developing subpleural banding in the lingula, which has progressed from the comparison CT and mild associated architectural distortion. This is in Amirah Goerke region of partial clearing of the ground-glass findings from the prior CT. No  pneumothorax or pleural effusion. Upper Abdomen: No acute. Musculoskeletal: No acute displaced fracture. Degenerative changes of the spine. Review of the MIP images confirms the above findings. IMPRESSION: Study is negative for pulmonary embolism. Typical ground-glass pulmonary changes of COVID infection, which has progressed from the comparison CT, involving all pulmonary lobes in Stony Stegmann cranial-caudal severity gradient. Mild architectural distortion and subpleural banding of the lingula, new from the comparison in Cadon Raczka region of partial clearing/resolution. Coronary artery disease. Reactive mediastinal adenopathy. Electronically Signed   By: JCorrie MckusickD.O.   On: 09/05/2019 10:26   CT Angio Chest PE W and/or Wo Contrast  Result Date: 08/27/2019 CLINICAL DATA:  Shortness of breath EXAM: CT ANGIOGRAPHY CHEST WITH CONTRAST TECHNIQUE: Multidetector CT imaging of the chest was performed using the standard protocol during bolus administration of intravenous contrast. Multiplanar CT image reconstructions and MIPs were obtained to evaluate the vascular anatomy. CONTRAST:  1073mOMNIPAQUE IOHEXOL 350 MG/ML SOLN COMPARISON:  None. FINDINGS: Cardiovascular: Evaluation for pulmonary emboli is limited by significant respiratory motion artifact.Given this limitation, there is no significant pulmonary embolism centrally. The main pulmonary artery is within normal limits for size. There is no CT evidence of acute right heart strain. The visualized aorta is normal. Heart size is normal, without pericardial effusion. Mediastinum/Nodes: --No mediastinal or hilar lymphadenopathy. --No axillary lymphadenopathy. --No supraclavicular lymphadenopathy. --Normal thyroid gland. --The esophagus is unremarkable Lungs/Pleura: There are diffuse bilateral ground-glass airspace opacities. There is no pneumothorax or large pleural effusion. The trachea is unremarkable. Upper Abdomen: No acute abnormality. Musculoskeletal: No chest wall  abnormality. No acute or significant osseous findings. Bilateral gynecomastia is noted. Review of the MIP images confirms the above findings. IMPRESSION: 1. Evaluation for pulmonary emboli is limited by significant respiratory motion artifact. Given this limitation, there is no significant pulmonary embolism centrally. 2. Diffuse bilateral ground-glass airspace opacities, consistent with the patient's history of viral pneumonia. Electronically Signed   By: ChConstance Holster.D.   On: 08/27/2019 23:09   PCXR COVID AM  Result Date: 09/11/2019 CLINICAL DATA:  COVID-19 pneumonia EXAM: PORTABLE CHEST 1 VIEW COMPARISON:  09/08/2019 FINDINGS: The heart size and mediastinal contours are within normal limits. Slight interval worsening of diffuse bilateral heterogeneous and interstitial airspace opacity, particularly in the right upper lung. The visualized skeletal structures are unremarkable. IMPRESSION: Slight interval worsening of diffuse bilateral heterogeneous and  interstitial airspace opacity, particularly in the right upper lung, consistent with worsened infection or edema. Electronically Signed   By: Eddie Candle M.D.   On: 09/11/2019 09:10   DG CHEST PORT 1 VIEW  Result Date: 09/08/2019 CLINICAL DATA:  Shortness of breath, COVID pneumonia EXAM: PORTABLE CHEST 1 VIEW COMPARISON:  Chest radiograph dated 09/04/2019 and CT chest dated 09/05/2019 FINDINGS: The heart size and mediastinal contours are within normal limits. Moderate to severe bilateral, lower lung predominant, airspace opacities have decreased on the right and have increased on the left compared to 09/04/2019. There is no pleural effusion or pneumothorax. The visualized skeletal structures are unremarkable. IMPRESSION: Moderate to severe bilateral, lower lung predominant, airspace opacities have decreased on the right and have increased on the left compared to 09/04/2019. These findings are consistent with COVID-19 pneumonia. Electronically Signed    By: Zerita Boers M.D.   On: 09/08/2019 09:23   DG CHEST PORT 1 VIEW  Result Date: 09/04/2019 CLINICAL DATA:  Coronavirus infection.  Follow-up pneumonia. EXAM: PORTABLE CHEST 1 VIEW COMPARISON:  08/27/2019 FINDINGS: Heart is mildly enlarged. There is worsening of widespread patchy bilateral pulmonary infiltrates, more severe in the lower lungs. No dense consolidation, lobar collapse or pleural effusion. IMPRESSION: Worsening of widespread bilateral pulmonary infiltrates, lower lobe predominant. Electronically Signed   By: Nelson Chimes M.D.   On: 09/04/2019 10:29   DG Chest Portable 1 View  Result Date: 08/27/2019 CLINICAL DATA:  Shortness of breath history of positive COVID test EXAM: PORTABLE CHEST 1 VIEW COMPARISON:  None. FINDINGS: Low lung volumes. Patchy airspace opacities throughout the right lung and at the left base. Heart size upper normal, likely augmented by low lung volume. Negative for pneumothorax. IMPRESSION: Patchy bilateral airspace opacities, suspect for pneumonia. Electronically Signed   By: Donavan Foil M.D.   On: 08/27/2019 20:54   ECHOCARDIOGRAM COMPLETE  Result Date: 09/11/2019   ECHOCARDIOGRAM REPORT   Patient Name:   DEMONTAY GRANTHAM Date of Exam: 09/11/2019 Medical Rec #:  [id number]      Height:       70.0 in Accession #:    [id number]     Weight:       266.8 lb Date of Birth:  1971-01-15      BSA:          2.36 m Patient Age:    15 years       BP:           108/79 mmHg Patient Gender: M              HR:           110 bpm. Exam Location:  Inpatient Procedure: 2D Echo Indications:    Elevated brain natriuretic peptide (BNP) level  History:        Patient has no prior history of Echocardiogram examinations.                 Risk Factors:Former Smoker and Diabetes.  Sonographer:    Jannett Celestine RDCS (AE) Referring Phys: [phone] Nikita Surman CALDWELL POWELL JR  Sonographer Comments: No parasternal window. Image acquisition challenging due to patient body habitus. IMPRESSIONS  1. Left  ventricular ejection fraction, by visual estimation, is 60 to 65%. The left ventricle has normal function. There is no left ventricular hypertrophy.  2. Left ventricular diastolic parameters are consistent with Grade I diastolic dysfunction (impaired relaxation).  3. The left ventricle has no regional wall motion abnormalities.  4. Global right ventricle  was not well visualized.The right ventricular size is not well visualized. Right vetricular wall thickness was not assessed.  5. Left atrial size was normal.  6. Right atrial size was normal.  7. The mitral valve is normal in structure. No evidence of mitral valve regurgitation. No evidence of mitral stenosis.  8. The tricuspid valve is normal in structure.  9. The aortic valve was not well visualized. Aortic valve regurgitation is not visualized. No evidence of aortic valve sclerosis or stenosis. 10. TR signal is inadequate for assessing pulmonary artery systolic pressure. 11. The inferior vena cava is normal in size with greater than 50% respiratory variability, suggesting right atrial pressure of 3 mmHg. 12. Technically difficult study with poor acoustic windows. FINDINGS  Left Ventricle: Left ventricular ejection fraction, by visual estimation, is 60 to 65%. The left ventricle has normal function. The left ventricle has no regional wall motion abnormalities. The left ventricular internal cavity size was the left ventricle is normal in size. There is no left ventricular hypertrophy. Left ventricular diastolic parameters are consistent with Grade I diastolic dysfunction (impaired relaxation). Right Ventricle: The right ventricular size is not well visualized. Right vetricular wall thickness was not assessed. Global RV systolic function is was not well visualized. Left Atrium: Left atrial size was normal in size. Right Atrium: Right atrial size was normal in size Pericardium: There is no evidence of pericardial effusion. Mitral Valve: The mitral valve is normal in  structure. No evidence of mitral valve regurgitation. No evidence of mitral valve stenosis by observation. Tricuspid Valve: The tricuspid valve is normal in structure. Tricuspid valve regurgitation is not demonstrated. Aortic Valve: The aortic valve was not well visualized. Aortic valve regurgitation is not visualized. The aortic valve is structurally normal, with no evidence of sclerosis or stenosis. Pulmonic Valve: The pulmonic valve was not well visualized. Pulmonic valve regurgitation is not visualized. Pulmonic regurgitation is not visualized. Aorta: The aortic root is normal in size and structure. Venous: The inferior vena cava is normal in size with greater than 50% respiratory variability, suggesting right atrial pressure of 3 mmHg. IAS/Shunts: No atrial level shunt detected by color flow Doppler.  LEFT ATRIUM             Index LA Vol (A2C):   36.3 ml 15.39 ml/m LA Vol (A4C):   34.4 ml 14.58 ml/m LA Biplane Vol: 36.6 ml 15.51 ml/m  AORTIC VALVE LVOT Vmax:   63.50 cm/s LVOT Vmean:  47.400 cm/s LVOT VTI:    0.071 m  SHUNTS Systemic VTI: 0.07 m  Henry Champagne MD Electronically signed by Henry Champagne MD Signature Date/Time: 09/11/2019/5:18:27 PM    Final    LE Venous  Result Date: 09/04/2019  Lower Venous Study Indications: Swelling.  Risk Factors: COVID 19 positive. Limitations: Body habitus and poor ultrasound/tissue interface. Comparison Study: No prior studies. Performing Technologist: Oliver Hum RVT  Examination Guidelines: Teddie Curd complete evaluation includes B-mode imaging, spectral Doppler, color Doppler, and power Doppler as needed of all accessible portions of each vessel. Bilateral testing is considered an integral part of Leslyn Monda complete examination. Limited examinations for reoccurring indications may be performed as noted.  +---------+---------------+---------+-----------+----------+--------------+ RIGHT    CompressibilityPhasicitySpontaneityPropertiesThrombus Aging  +---------+---------------+---------+-----------+----------+--------------+ CFV      Full           Yes      Yes                                 +---------+---------------+---------+-----------+----------+--------------+  SFJ      Full                                                        +---------+---------------+---------+-----------+----------+--------------+ FV Prox  Full                                                        +---------+---------------+---------+-----------+----------+--------------+ FV Mid   Full                                                        +---------+---------------+---------+-----------+----------+--------------+ FV DistalFull                                                        +---------+---------------+---------+-----------+----------+--------------+ PFV      Full                                                        +---------+---------------+---------+-----------+----------+--------------+ POP      Full           Yes      Yes                                 +---------+---------------+---------+-----------+----------+--------------+ PTV      Full                                                        +---------+---------------+---------+-----------+----------+--------------+ PERO     Full                                                        +---------+---------------+---------+-----------+----------+--------------+   +---------+---------------+---------+-----------+----------+--------------+ LEFT     CompressibilityPhasicitySpontaneityPropertiesThrombus Aging +---------+---------------+---------+-----------+----------+--------------+ CFV      Full           Yes      Yes                                 +---------+---------------+---------+-----------+----------+--------------+ SFJ      Full                                                         +---------+---------------+---------+-----------+----------+--------------+  FV Prox  Full                                                        +---------+---------------+---------+-----------+----------+--------------+ FV Mid   Full                                                        +---------+---------------+---------+-----------+----------+--------------+ FV DistalFull                                                        +---------+---------------+---------+-----------+----------+--------------+ PFV      Full                                                        +---------+---------------+---------+-----------+----------+--------------+ POP      Full           Yes      Yes                                 +---------+---------------+---------+-----------+----------+--------------+ PTV      Full                                                        +---------+---------------+---------+-----------+----------+--------------+ PERO     Full                                                        +---------+---------------+---------+-----------+----------+--------------+     Summary: Right: There is no evidence of deep vein thrombosis in the lower extremity. No cystic structure found in the popliteal fossa. Left: There is no evidence of deep vein thrombosis in the lower extremity. No cystic structure found in the popliteal fossa.  *See table(s) above for measurements and observations. Electronically signed by Henry Jews MD on 09/04/2019 at 4:41:01 PM.    Final     Microbiology: No results found for this or any previous visit (from the past 240 hour(s)).   Labs: Basic Metabolic Panel: Recent Labs  Lab 09/13/19 0015 09/14/19 0116 09/15/19 0116 09/16/19 0114 09/17/19 0507  NA 138 137 137 136 138  K 3.7 3.5 3.6 3.6 3.3*  CL 98 99 100 101 105  CO2 _0 GLUCOSE 141* 113* 121* 125* 124*  BUN 21* _1 CREATININE 0.86 0.78 0.69 0.78  0.70  CALCIUM 9.2 8.8* 8.8* 8.7* 8.5*  MG 2.4 2.3 2.2 2.0 2.1  PHOS 4.4  4.3 4.6 4.2 4.3   Liver Function Tests: Recent Labs  Lab 09/13/19 0015 09/14/19 0116 09/15/19 0116 09/16/19 0114 09/17/19 0507  AST _0 33  ALT 70* 60* 57* 65* 71*  ALKPHOS 68 62 55 52 52  BILITOT 0.9 1.1 1.0 1.1 0.7  PROT 6.4* 6.4* 5.9* 5.6* 5.5*  ALBUMIN 3.9 3.9 3.6 3.5 3.3*   No results for input(s): LIPASE, AMYLASE in the last 168 hours. No results for input(s): AMMONIA in the last 168 hours. CBC: Recent Labs  Lab 09/13/19 0015 09/14/19 0116 09/15/19 0116 09/16/19 0114 09/17/19 0507  WBC 9.2 7.1 6.2 5.0 4.5  NEUTROABS 7.1 4.4 3.6 2.4 2.1  HGB 16.2 15.7 14.8 14.4 14.5  HCT 48.5 47.1 43.9 43.0 41.5  MCV 89.3 89.0 88.9 88.5 88.9  PLT 176 157 146* 130* 121*   Cardiac Enzymes: No results for input(s): CKTOTAL, CKMB, CKMBINDEX, TROPONINI in the last 168 hours. BNP: BNP (last 3 results) Recent Labs    08/27/19 2030 09/11/19 0417  BNP 21.0 156.8*    ProBNP (last 3 results) No results for input(s): PROBNP in the last 8760 hours.  CBG: Recent Labs  Lab 09/16/19 1643 09/16/19 2102 09/17/19 0759 09/17/19 1238 09/17/19 1744  GLUCAP 149* 138* 136* 144* 116*       Signed:  Fayrene Helper MD.  [facility] 09/17/2019, 7:12 PM

## 2019-09-23 ENCOUNTER — Encounter (INDEPENDENT_AMBULATORY_CARE_PROVIDER_SITE_OTHER): Payer: Self-pay | Admitting: Primary Care

## 2019-09-23 ENCOUNTER — Other Ambulatory Visit: Payer: Self-pay

## 2019-09-23 ENCOUNTER — Ambulatory Visit (INDEPENDENT_AMBULATORY_CARE_PROVIDER_SITE_OTHER): Payer: Commercial Managed Care - PPO | Admitting: Primary Care

## 2019-09-23 DIAGNOSIS — Z09 Encounter for follow-up examination after completed treatment for conditions other than malignant neoplasm: Secondary | ICD-10-CM

## 2019-09-23 DIAGNOSIS — R197 Diarrhea, unspecified: Secondary | ICD-10-CM

## 2019-09-23 DIAGNOSIS — R05 Cough: Secondary | ICD-10-CM

## 2019-09-23 DIAGNOSIS — R0602 Shortness of breath: Secondary | ICD-10-CM

## 2019-09-23 DIAGNOSIS — Z8616 Personal history of COVID-19: Secondary | ICD-10-CM

## 2019-09-23 DIAGNOSIS — R0981 Nasal congestion: Secondary | ICD-10-CM

## 2019-09-23 NOTE — Progress Notes (Signed)
Virtual Visit via Telephone Note  I connected with Henry Miller on 09/23/19 at  9:50 AM EST by telephone and verified that I am speaking with the correct person using two identifiers.   I discussed the limitations, risks, security and privacy concerns of performing an evaluation and management service by telephone and the availability of in person appointments. I also discussed with the patient that there may be a patient responsible charge related to this service. The patient expressed understanding and agreed to proceed.   History of Present Illness: Henry Miller is a hospital follow up for COVID his only concern is with diabetes and deferred this to his PCP Dr. Sherwood [facility]  [facility] in [place] , [place].   Observations/Objective: Review of Systems  HENT: Positive for congestion.   Respiratory: Positive for cough and shortness of breath.   Gastrointestinal: Positive for diarrhea.  All other systems reviewed and are negative.    Assessment and Plan: Meghan was seen today for hospitalization follow-up.  Diagnoses and all orders for this visit:  Hospital discharge follow-up Henry Miller presented to the emergency department on 08/27/2019 with progressively worse dyspnea and was positive for COVID .-  at home was 86% on room air, EMS placed him in 6 L and improved to 89 found tachycardic in the ED and satting 89% on 6 L  Admitted and started on remdesivir and steroids oxygen supplementation was increased to 15 L and saturations improved. He was experiencing  worsening shortness of breath relates to positive for SARS-CoV-2 on 08/26/2019 and Pneumonia due to COVID-19 virus. He still has complaints of diarrhea , cough , shortness of breath and congestion. Patient has a PCP appointment today that will be able to address any other problems/   Follow Up Instructions:    I discussed the assessment and treatment plan with the patient. The patient was provided an opportunity to ask questions  and all were answered. The patient agreed with the plan and demonstrated an understanding of the instructions.   The patient was advised to call back or seek an in-person evaluation if the symptoms worsen or if the condition fails to improve as anticipated.  I provided 15 minutes of non-face-to-face time during this encounter. Includes reviewing hospital encounters, labs and imaging     Grayce Sessions, NP

## 2019-10-08 ENCOUNTER — Encounter: Payer: Self-pay | Admitting: Pulmonary Disease

## 2019-10-08 ENCOUNTER — Other Ambulatory Visit: Payer: Self-pay

## 2019-10-08 ENCOUNTER — Ambulatory Visit (INDEPENDENT_AMBULATORY_CARE_PROVIDER_SITE_OTHER): Payer: Commercial Managed Care - PPO | Admitting: Pulmonary Disease

## 2019-10-08 VITALS — BP 126/78 | HR 97 | Ht 70.0 in | Wt 278.4 lb

## 2019-10-08 DIAGNOSIS — J1282 Pneumonia due to coronavirus disease 2019: Secondary | ICD-10-CM

## 2019-10-08 DIAGNOSIS — U071 COVID-19: Secondary | ICD-10-CM | POA: Diagnosis not present

## 2019-10-08 DIAGNOSIS — Z5189 Encounter for other specified aftercare: Secondary | ICD-10-CM | POA: Insufficient documentation

## 2019-10-08 DIAGNOSIS — J9601 Acute respiratory failure with hypoxia: Secondary | ICD-10-CM | POA: Diagnosis not present

## 2019-10-08 DIAGNOSIS — Z5181 Encounter for therapeutic drug level monitoring: Secondary | ICD-10-CM | POA: Diagnosis not present

## 2019-10-08 DIAGNOSIS — R0683 Snoring: Secondary | ICD-10-CM

## 2019-10-08 MED ORDER — BUDESONIDE-FORMOTEROL FUMARATE 160-4.5 MCG/ACT IN AERO: 2.0000 | INHALATION_SPRAY | Freq: Two times a day (BID) | RESPIRATORY_TRACT | 6 refills | Status: DC

## 2019-10-08 MED ORDER — ALBUTEROL SULFATE HFA 108 (90 BASE) MCG/ACT IN AERS: 2.0000 | INHALATION_SPRAY | Freq: Four times a day (QID) | RESPIRATORY_TRACT | 6 refills | Status: DC | PRN

## 2019-10-08 NOTE — Patient Instructions (Addendum)
Continue supplemental oxygen to maintain oxygen saturations >88%   Will arrange for CT scan of your chest   I have ordered a home sleep study for possible obstructive sleep apnea. You will be contacted to schedule this.   We will do pulmonary function tests at follow-up.   Follow-up with me in 1 month after your CT Chest

## 2019-10-08 NOTE — Progress Notes (Signed)
Subjective:   PATIENT ID: Henry Miller GENDER: male DOB: 1970/10/22, MRN: [id number]   HPI  Chief Complaint  Patient presents with  . Consult    Referred by Dr. Gerarda Fraction due to covid-19 pna diagnosis 12/29. Pt went from [facility] and was admitted to [facility] for 21 days. Pt was discharged home on O2 and wears between 2-3L almost 24/7.    Reason for Visit: New consult for new oxygen requirement after covid-19 pneumonia  Henry Miller is a 49yo male with PMH of TIIDM and morbid obesity presenting after covid-19 pneumonia this past December with new oxygen requirement at discharge.   Discharge summary from Dr. Fayrene Helper 09/17/2019 reviewed. Initially diagnosed with covid-19 12/28 and admitted the following day. Treated with remdesivir, steroids, and actemra and discharged with new supplemental O2 requirement of 2L with activity. Two CTA's were done and were negative for PE. Echo during admission showed grade I diastolic dysfunction but otherwise normal. Additionally noted to be thrombocytopenic during admission. Discharge summary notes history of DVT/PE but patient is unaware of this diagnosis. He is not on anticoagulation.   Since discharge he has been improving. Has decreased from 3L to down to 1.5L most of the time. Climbs 16 steps to his bedroom every night and this has become easier. He is walking around as much as he can. He was initially at 5L while showering and is now down to 3L. The steam can make him more short of breath.   He tries to remove oxygen at rest but desaturates sometimes to around 85%-88%. If he breathes deeply and quickly he can increase to mid 90s. He only coughs when using incentive spirometer and flutter valve. Denies wheezing. He is supposed to be getting a portable concentrator to return to work.  No history of limitations or shortness of breath prior to his covid diagnosis and was very active. He has lost weight since his diagnosis.  Prior to covid  diagnosis he states his wife told him he was snoring and sounded like he would stop breathing. He has chronic sleepiness but attributes this to working third shift.   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  Past Medical History:  Diagnosis Date  . Pancreatitis   . Type 2 diabetes mellitus (HCC)      Family History  Problem Relation Age of Onset  . Diabetes Father   . Diabetes Maternal Grandmother      Social History   Occupational History  . Not on file  Tobacco Use  . Smoking status: Former Smoker    Packs/day: 1.00    Years: 5.00    Pack years: 5.00  . Smokeless tobacco: Former Systems developer    Types: Snuff  Substance and Sexual Activity  . Alcohol use: No  . Drug use: No  . Sexual activity: Not on file    No Known Allergies   Outpatient Medications Prior to Visit  Medication Sig Dispense Refill  . bismuth subsalicylate (PEPTO BISMOL) 262 MG chewable tablet Chew 524 mg by mouth once as needed for diarrhea or loose stools.    . blood glucose meter kit and supplies KIT Dispense based on patient and insurance preference. Use up to four times daily as directed. (FOR ICD-9  250.00, 250.01). 1 each 0  . fluticasone (FLONASE) 50 MCG/ACT nasal spray Place 2 sprays into both nostrils daily.    Marland Kitchen LANTUS SOLOSTAR 100 UNIT/ML Solostar Pen Inject 10 Units into the skin at bedtime.    Marland Kitchen linagliptin (TRADJENTA) 5 MG TABS tablet Take 1 tablet (5 mg total) by mouth daily. 30 tablet 0  . metFORMIN (GLUCOPHAGE) 1000 MG tablet Take 1,000 mg by mouth 2 (two) times daily.    . metFORMIN (GLUCOPHAGE) 500 MG tablet Take 1 tablet (500 mg total) by mouth 2 (two) times daily with a meal. 60 tablet 0   No facility-administered  medications prior to visit.    Review of Systems  Respiratory: Positive for shortness of breath. Negative for cough, hemoptysis and wheezing.      Objective:   Vitals:   10/08/19 1507  BP: 126/78  Pulse: 97  SpO2: 98%  Weight: 278 lb 6.4 oz (126.3 kg)  Height: _0  (1.778 m)      Physical Exam: General: Well-appearing, no acute distress, dyspneic with talking HENT: [place], AT, OP clear, MMM Eyes: EOMI, no scleral icterus Respiratory: Clear to auscultation bilaterally.  No crackles, wheezing or rales Cardiovascular: tachycardic, regular rhythm, -M/R/G, no JVD Extremities:-Edema,-tenderness Neuro: AAO x4, CNII-XII grossly intact Skin: Intact, no rashes or bruising Psych: Normal mood, normal affect  Data Reviewed:  Imaging: CT 1/07: Increased bilateral ground glass opacities compared to prior CT 12/29.  CT 12/29: diffuse bilateral ground glass opacities. No PE. LE Vas [place] 1/06: negative for DVT   Imaging, labs and tests noted above have been reviewed independently by me.     Assessment & Plan:  Henry Miller is a 49yo male with PMH of DVT/PE, TIIDM, and morbid obesity presenting after covid-19 pneumonia this past December with new oxygen requirement at discharge. Prolonged recovery still using oxygen although this has improved from around 3L to 1.5L. 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 Additionally has symptoms of OSA and has high risk factors.   Discussion:  Covid-19 with New Supplemental O2 Requirement Ambulate with oxygen  Will need repeat high res CT around 11/03/19 Follow up three months - PFTs prior to this Start symbicort 160/4.5 two puffs bid Start albuterol prn  High Risk OSA  Home sleep study    Health Maintenance  There is no immunization history on file for this patient.   CT Lung Screen - n/a  Orders Placed This Encounter  Procedures  . CT Chest W Contrast    Please schedule first week of  March before follow up with Dr. Loanne Drilling    Standing Status:   Future    Standing Expiration Date:   12/05/2020    Order Specific Question:   ** REASON FOR EXAM (FREE TEXT)    Answer:   covid 19 pneumonia, mediastinal adenopathy    Order Specific Question:   If indicated for the ordered procedure, I authorize the administration of contrast media per Radiology protocol    Answer:   Yes    Order Specific Question:   Preferred imaging location?    Answer:   [facility]    Order Specific Question:   Radiology Contrast Protocol - do NOT remove file path    Answer:   \\[email or web address].[email or web address]  . Basic Metabolic Panel (BMET)    Standing Status:   Future    Standing Expiration Date:   10/07/2020  . Pulmonary function  test    Standing Status:   Future    Standing Expiration Date:   10/07/2020    Order Specific Question:   Where should this test be performed?    Answer:   [facility]    Order Specific Question:   Full PFT: includes the following: basic spirometry, spirometry pre & post bronchodilator, diffusion capacity (DLCO), lung volumes    Answer:   Full PFT  . Home sleep test    Standing Status:   Future    Standing Expiration Date:   10/07/2020    Order Specific Question:   Where should this test be performed:    Answer:   [facility]   Meds ordered this encounter  Medications  . albuterol (VENTOLIN HFA) 108 (90 Base) MCG/ACT inhaler    Sig: Inhale 2 puffs into the lungs every 6 (six) hours as needed for wheezing or shortness of breath.    Dispense:  8 g    Refill:  6  . budesonide-formoterol (SYMBICORT) 160-4.5 MCG/ACT inhaler    Sig: Inhale 2 puffs into the lungs 2 (two) times daily.    Dispense:  1 Inhaler    Refill:  6    Return in about 3 months (around 01/05/2020).  Marty Heck, MD [facility] 10/08/2019 3:06 PM  Office Number [phone]  I have taken an interval history, reviewed the chart and examined the patient  myself. I have discussed patient's case and agree with the resident's note, impression, and recommendations as outlined.   49 year old male with prior COVID-19 pneumonia with resultant chronic hypoxemic respiratory failure. In-clinic ambulatory O2 still indicates oxygen requirement but improved to 2L. On exam, patient appears chronically ill, diminished breath sounds bilaterally, cardiac exam RRR, no murmur, no pedal edema. Reviewed chest imaging which demonstrated extensive dense bilateral consolidations related to viral illness.  A/P Acute hypoxemic respiratory failure secondary to COVID-19 pneumonia - improving. Recommended pulmonary rehab however patient declined due to work schedule (3rd shift). Will prescribe LABA/ICS. Continue supplemental oxygen. Plan for repeat CT chest and PFTs.  Suspected OSA - intermediate risk on STOP-BANG screen including witnessed apneas. Will obtain home sleep study  I have independently spent a total time of 46-minutes related to patient care.  Rodman Pickle, M.D. [facility] 10/08/2019 9:44 PM

## 2019-10-21 ENCOUNTER — Other Ambulatory Visit (INDEPENDENT_AMBULATORY_CARE_PROVIDER_SITE_OTHER): Payer: Commercial Managed Care - PPO

## 2019-10-21 DIAGNOSIS — Z5181 Encounter for therapeutic drug level monitoring: Secondary | ICD-10-CM

## 2019-10-22 LAB — BASIC METABOLIC PANEL
BUN: 10 mg/dL (ref 6–23)
CO2: 26 mEq/L (ref 19–32)
Calcium: 10.2 mg/dL (ref 8.4–10.5)
Chloride: 101 mEq/L (ref 96–112)
Creatinine, Ser: 0.71 mg/dL (ref 0.40–1.50)
GFR: 118.36 mL/min (ref >60.00)
Glucose, Bld: 154 mg/dL — ABNORMAL HIGH (ref 70–99)
Potassium: 4.5 mEq/L (ref 3.5–5.1)
Sodium: 137 mEq/L (ref 135–145)

## 2019-10-25 ENCOUNTER — Other Ambulatory Visit: Payer: Self-pay

## 2019-10-25 ENCOUNTER — Ambulatory Visit: Payer: Commercial Managed Care - PPO

## 2019-10-25 DIAGNOSIS — R0683 Snoring: Secondary | ICD-10-CM

## 2019-10-27 DIAGNOSIS — G4733 Obstructive sleep apnea (adult) (pediatric): Secondary | ICD-10-CM

## 2019-10-29 ENCOUNTER — Ambulatory Visit (HOSPITAL_COMMUNITY)
Admission: RE | Admit: 2019-10-29 | Discharge: 2019-10-29 | Disposition: A | Discharge status: Home / Self Care | Payer: Commercial Managed Care - PPO | Source: Ambulatory Visit | Attending: Pulmonary Disease | Admitting: Pulmonary Disease

## 2019-10-29 ENCOUNTER — Other Ambulatory Visit: Payer: Self-pay

## 2019-10-29 DIAGNOSIS — J1282 Pneumonia due to coronavirus disease 2019: Secondary | ICD-10-CM | POA: Diagnosis present

## 2019-10-29 DIAGNOSIS — U071 COVID-19: Secondary | ICD-10-CM | POA: Diagnosis not present

## 2019-10-29 MED ORDER — IOHEXOL 300 MG/ML  SOLN
75.0000 mL | Freq: Once | INTRAMUSCULAR | Status: AC | PRN
  Administered 2019-10-29: 75 mL via INTRAVENOUS

## 2019-11-01 DIAGNOSIS — G4733 Obstructive sleep apnea (adult) (pediatric): Secondary | ICD-10-CM

## 2019-11-05 ENCOUNTER — Encounter: Payer: Self-pay | Admitting: Pulmonary Disease

## 2019-11-05 ENCOUNTER — Other Ambulatory Visit: Payer: Self-pay

## 2019-11-05 ENCOUNTER — Ambulatory Visit (INDEPENDENT_AMBULATORY_CARE_PROVIDER_SITE_OTHER): Payer: Commercial Managed Care - PPO | Admitting: Pulmonary Disease

## 2019-11-05 VITALS — BP 124/76 | HR 111 | Temp 97.1°F | Ht 70.0 in | Wt 291.2 lb

## 2019-11-05 DIAGNOSIS — J1282 Pneumonia due to coronavirus disease 2019: Secondary | ICD-10-CM

## 2019-11-05 DIAGNOSIS — G4731 Primary central sleep apnea: Secondary | ICD-10-CM | POA: Diagnosis not present

## 2019-11-05 DIAGNOSIS — J9601 Acute respiratory failure with hypoxia: Secondary | ICD-10-CM | POA: Diagnosis not present

## 2019-11-05 DIAGNOSIS — U071 COVID-19: Secondary | ICD-10-CM

## 2019-11-05 MED ORDER — BUDESONIDE-FORMOTEROL FUMARATE 160-4.5 MCG/ACT IN AERO: 2.0000 | INHALATION_SPRAY | Freq: Two times a day (BID) | RESPIRATORY_TRACT | 6 refills | Status: DC

## 2019-11-05 NOTE — Patient Instructions (Addendum)
Acute hypoxemic respiratory failure secondary to COVID-19 pneumonia - dyspnea improving CT Chest with improving consolidation.  --Ambulatory oxygen demonstrates no desaturations. DISCONTINUE oxygen --CONTINUE Symbicort TWO puffs TWICE a day  --CT Chest without contrast in 6 months  Complex sleep apneas --Order in-lab PAP titration --Refer to Sleep Physician  Follow-up in 6 months

## 2019-11-05 NOTE — Progress Notes (Signed)
Subjective:   PATIENT ID: Henry Miller GENDER: male DOB: [birth year], MRN: [id number]   HPI  Chief Complaint  Patient presents with  . Follow-up    Reason for Visit: Follow-up  Henry Miller is a 49yo male with morbid obesity and DM2 who presents with COVID-19 pneumonia requiring hospitalization in Jan 2021.  Since our last visit, he reports his respiratory symptoms and stamina has overall improved. He no longer requires oxygen and reports saturations >90%. Able to climb steps. Tolerating Symbicort with any issues. Denies coughing and wheezing. He has some allergies which he is taking zyrtec.  He did complete his sleep test and stated he felt restless during the study.  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 Past Medical History:  Diagnosis Date  . Pancreatitis   . Type 2 diabetes mellitus ([place])     Outpatient Medications Prior to Visit  Medication Sig Dispense Refill  . albuterol (VENTOLIN HFA) 108 (90 Base) MCG/ACT inhaler Inhale 2 puffs into the lungs every 6 (six) hours as needed for wheezing or shortness of breath. 8 g 6  . bismuth subsalicylate (PEPTO BISMOL) 262 MG chewable tablet Chew 524 mg by mouth once as needed for diarrhea or loose stools.    . blood glucose meter kit and supplies KIT Dispense based on patient and insurance preference. Use up to four times daily as directed. (FOR ICD-9 250.00, 250.01). 1 each 0  . fluticasone (FLONASE) 50 MCG/ACT nasal spray Place 2 sprays into both nostrils daily.    . metFORMIN (GLUCOPHAGE) 1000 MG tablet Take 1,000 mg by mouth 2 (two) times daily.    . mupirocin ointment (BACTROBAN) 2 % Place 1 application into the nose 2 (two) times daily.    .  budesonide-formoterol (SYMBICORT) 160-4.5 MCG/ACT inhaler Inhale 2 puffs into the lungs 2 (two) times daily. 1 Inhaler 6  . linagliptin (TRADJENTA) 5 MG TABS tablet Take 1 tablet (5 mg total) by mouth daily. 30 tablet 0  . LANTUS SOLOSTAR 100 UNIT/ML Solostar Pen Inject 10 Units into the skin at bedtime.     No facility-administered medications prior to visit.    Review of Systems  Constitutional: Negative for chills, diaphoresis, fever, malaise/fatigue and weight loss.  HENT: Negative for congestion.   Respiratory: Positive for shortness of breath. Negative for cough, hemoptysis, sputum production and wheezing.   Cardiovascular: Negative for chest pain, palpitations and leg swelling.    Objective:   Vitals:   11/05/19 1325  BP: 124/76  Pulse: (!) 111  Temp: (!) 97.1 F (36.2 C)  TempSrc: Temporal  SpO2: 97%  Weight: 291 lb 3.2 oz (132.1 kg)  Height: '5\' 10"'  (1.778 m)   SpO2: 97 %(RA) O2 Device: None (Room air)  Physical Exam: General: Well-appearing, no acute distress HENT: [place], AT Eyes: EOMI, no scleral icterus Respiratory: Clear to auscultation bilaterally.  No crackles, wheezing or rales Cardiovascular: RRR, -M/R/G, no JVD Extremities:-Edema,-tenderness Neuro: AAO x4, CNII-XII grossly intact Skin: Intact, no rashes or bruising Psych: Normal mood, normal affect  Data Reviewed:  Imaging: CT 1/07: Increased bilateral ground glass opacities compared to prior CT 12/29.  CT 12/29: diffuse bilateral ground glass opacities. No PE. 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 Recommend PAP titration  Assessment & Plan:  Discussion: 49 year old male with prior COVID-19 pneumonia with resultant chronic hypoxemic respiratory failure. CT Chest demonstrates improving infiltrates and patient has improved dyspnea and self-monitored O2 sats. We reviewed his  CT scan and sleep study together.  Acute hypoxemic respiratory failure secondary to COVID-19 pneumonia - dyspnea improving and oxygenation resolved CT Chest with improving consolidation.  --Ambulatory oxygen demonstrates no desaturations. DISCONTINUE oxygen --CONTINUE Symbicort TWO puffs TWICE a day  --CT Chest without contrast in 6 months  Complex sleep apneas We discussed the natural history, progression and prognosis of sleep apnea, treatment with PAP and alternative treatment strategies were discussed. The patient was also educated regarding the long term cardiovascular benefits of treating sleep apnea, including improved blood pressure control, reduction in MI and stroke risk as well as other potential benefits of treatment, such as improved glycemic control, facilitation of weight loss, improved energy during the day and improved sleep quality. --Order in-lab PAP titration --Refer to Sleep Physician --Counseled on sleep hygiene --Counseled NOT to drive if/when sleepy  Health Maintenance  There is no immunization history on file for this patient.   CT Lung Screen - n/a  Orders Placed This Encounter  Procedures  . CT Chest Wo Contrast    Standing Status:   Future    Standing Expiration Date:   01/04/2021    Order Specific Question:   ** REASON FOR EXAM (FREE TEXT)    Answer:   Covid Pneumonia    Order Specific Question:   Preferred imaging location?    Answer:   [facility]    Order Specific Question:   Radiology Contrast Protocol - do NOT remove file path    Answer:   [email or web address]  . Ambulatory Referral for DME    Referral Priority:   Routine    Referral Type:   Durable Medical Equipment Purchase    Number of Visits Requested:   1  . Cpap titration    In lab sleep titration    Standing Status:   Future    Standing Expiration Date:   11/04/2020    Order Specific Question:   Where should this test be performed:    Answer:   [facility] ordered this encounter  Medications  . budesonide-formoterol (SYMBICORT) 160-4.5 MCG/ACT inhaler    Sig: Inhale 2 puffs into the lungs 2 (two) times daily.    Dispense:  1 Inhaler    Refill:  6    Return in about 6 months (around 05/07/2020) for with me.  [place], MD [facility] 11/05/2019 3:02 PM  Office Number [phone]

## 2019-11-13 ENCOUNTER — Other Ambulatory Visit (HOSPITAL_COMMUNITY): Payer: Commercial Managed Care - PPO

## 2019-11-19 ENCOUNTER — Ambulatory Visit (INDEPENDENT_AMBULATORY_CARE_PROVIDER_SITE_OTHER): Payer: Commercial Managed Care - PPO | Admitting: Pulmonary Disease

## 2019-11-19 ENCOUNTER — Encounter: Payer: Self-pay | Admitting: Pulmonary Disease

## 2019-11-19 ENCOUNTER — Other Ambulatory Visit: Payer: Self-pay

## 2019-11-19 VITALS — BP 132/82 | HR 100 | Temp 97.3°F | Ht 70.0 in | Wt 296.6 lb

## 2019-11-19 DIAGNOSIS — G4719 Other hypersomnia: Secondary | ICD-10-CM

## 2019-11-19 DIAGNOSIS — G4733 Obstructive sleep apnea (adult) (pediatric): Secondary | ICD-10-CM | POA: Diagnosis not present

## 2019-11-19 NOTE — Patient Instructions (Signed)
Severe obstructive sleep apnea Titration study scheduled for 14 April   Continue weight loss efforts  Call with any significant concerns  I will see you back in about 2 months  Sleep Apnea Sleep apnea is a condition in which breathing pauses or becomes shallow during sleep. Episodes of sleep apnea usually last 10 seconds or longer, and they may occur as many as 20 times an hour. Sleep apnea disrupts your sleep and keeps your body from getting the rest that it needs. This condition can increase your risk of certain health problems, including:  Heart attack.  Stroke.  Obesity.  Diabetes.  Heart failure.  Irregular heartbeat. What are the causes? There are three kinds of sleep apnea:  Obstructive sleep apnea. This kind is caused by a blocked or collapsed airway.  Central sleep apnea. This kind happens when the part of the brain that controls breathing does not send the correct signals to the muscles that control breathing.  Mixed sleep apnea. This is a combination of obstructive and central sleep apnea. The most common cause of this condition is a collapsed or blocked airway. An airway can collapse or become blocked if:  Your throat muscles are abnormally relaxed.  Your tongue and tonsils are larger than normal.  You are overweight.  Your airway is smaller than normal. What increases the risk? You are more likely to develop this condition if you:  Are overweight.  Smoke.  Have a smaller than normal airway.  Are elderly.  Are male.  Drink alcohol.  Take sedatives or tranquilizers.  Have a family history of sleep apnea. What are the signs or symptoms? Symptoms of this condition include:  Trouble staying asleep.  Daytime sleepiness and tiredness.  Irritability.  Loud snoring.  Morning headaches.  Trouble concentrating.  Forgetfulness.  Decreased interest in sex.  Unexplained sleepiness.  Mood swings.  Personality changes.  Feelings of  depression.  Waking up often during the night to urinate.  Dry mouth.  Sore throat. How is this diagnosed? This condition may be diagnosed with:  A medical history.  A physical exam.  A series of tests that are done while you are sleeping (sleep study). These tests are usually done in a sleep lab, but they may also be done at home. How is this treated? Treatment for this condition aims to restore normal breathing and to ease symptoms during sleep. It may involve managing health issues that can affect breathing, such as high blood pressure or obesity. Treatment may include:  Sleeping on your side.  Using a decongestant if you have nasal congestion.  Avoiding the use of depressants, including alcohol, sedatives, and narcotics.  Losing weight if you are overweight.  Making changes to your diet.  Quitting smoking.  Using a device to open your airway while you sleep, such as: ? An oral appliance. This is a custom-made mouthpiece that shifts your lower jaw forward. ? A continuous positive airway pressure (CPAP) device. This device blows air through a mask when you breathe out (exhale). ? A nasal expiratory positive airway pressure (EPAP) device. This device has valves that you put into each nostril. ? A bi-level positive airway pressure (BPAP) device. This device blows air through a mask when you breathe in (inhale) and breathe out (exhale).  Having surgery if other treatments do not work. During surgery, excess tissue is removed to create a wider airway. It is important to get treatment for sleep apnea. Without treatment, this condition can lead to:  High  blood pressure.  Coronary artery disease.  In men, an inability to achieve or maintain an erection (impotence).  Reduced thinking abilities. Follow these instructions at home: Lifestyle  Make any lifestyle changes that your health care provider recommends.  Eat a healthy, well-balanced diet.  Take steps to lose weight  if you are overweight.  Avoid using depressants, including alcohol, sedatives, and narcotics.  Do not use any products that contain nicotine or tobacco, such as cigarettes, e-cigarettes, and chewing tobacco. If you need help quitting, ask your health care provider. General instructions  Take over-the-counter and prescription medicines only as told by your health care provider.  If you were given a device to open your airway while you sleep, use it only as told by your health care provider.  If you are having surgery, make sure to tell your health care provider you have sleep apnea. You may need to bring your device with you.  Keep all follow-up visits as told by your health care provider. This is important. Contact a health care provider if:  The device that you received to open your airway during sleep is uncomfortable or does not seem to be working.  Your symptoms do not improve.  Your symptoms get worse. Get help right away if:  You develop: ? Chest pain. ? Shortness of breath. ? Discomfort in your back, arms, or stomach.  You have: ? Trouble speaking. ? Weakness on one side of your body. ? Drooping in your face. These symptoms may represent a serious problem that is an emergency. Do not wait to see if the symptoms will go away. Get medical help right away. Call your local emergency services (911 in the [place] Do not drive yourself to the hospital. Summary  Sleep apnea is a condition in which breathing pauses or becomes shallow during sleep.  The most common cause is a collapsed or blocked airway.  The goal of treatment is to restore normal breathing and to ease symptoms during sleep. This information is not intended to replace advice given to you by your health care provider. Make sure you discuss any questions you have with your health care provider. Document Revised: 01/30/2019 Document Reviewed: 04/10/2018 Elsevier Patient Education  [place].

## 2019-11-19 NOTE — Progress Notes (Signed)
Subjective:    Patient ID: Henry Miller, male    DOB: 10-04-1970, 49 y.o.   MRN: [id number]  Patient with a history of obstructive sleep apnea  Recent home sleep study did reveal severe obstructive sleep apnea with desaturations He is scheduled for a titration study  Admits to dryness of his mouth in the mornings No headaches Admits to sweating at night No nocturia No family history of obstructive sleep apnea  Works third shift takes him about 10 to 15 minutes to fall asleep 2-3 awakenings  Variability in his sleep and wake times depending on work schedule  Has managed to lose about 70-100 pounds lately-watching his diet and exercising regularly   Recently had Covid infection  Past Medical History:  Diagnosis Date  . Pancreatitis   . Type 2 diabetes mellitus ([place])    Social History   Socioeconomic History  . Marital status: Married    Spouse name: Not on file  . Number of children: Not on file  . Years of education: Not on file  . Highest education level: Not on file  Occupational History  . Not on file  Tobacco Use  . Smoking status: Former Smoker    Packs/day: 1.00    Years: 5.00    Pack years: 5.00    Types: Cigarettes    Quit date: 09/2005    Years since quitting: 14.1  . Smokeless tobacco: Former Systems developer    Types: Snuff  Substance and Sexual Activity  . Alcohol use: No  . Drug use: No  . Sexual activity: Not on file  Other Topics Concern  . Not on file  Social History Narrative  . Not on file   Social Determinants of Health   Financial Resource Strain:   . Difficulty of Paying Living Expenses:   Food Insecurity:   . Worried About Charity fundraiser in the Last Year:   . Arboriculturist in the Last Year:   Transportation Needs:   . Film/video editor (Medical):   Marland Kitchen Lack of Transportation (Non-Medical):   Physical Activity:   . Days of Exercise per Week:   . Minutes of Exercise per Session:   Stress:   . Feeling of Stress :   Social  Connections:   . Frequency of Communication with Friends and Family:   . Frequency of Social Gatherings with Friends and Family:   . Attends Religious Services:   . Active Member of Clubs or Organizations:   . Attends Archivist Meetings:   Marland Kitchen Marital Status:   Intimate Partner Violence:   . Fear of Current or Ex-Partner:   . Emotionally Abused:   Marland Kitchen Physically Abused:   . Sexually Abused:    Family History  Problem Relation Age of Onset  . Diabetes Father   . Diabetes Maternal Grandmother    Review of Systems  Constitutional: Positive for unexpected weight change. Negative for fever.  HENT: Positive for congestion and dental problem. Negative for ear pain, nosebleeds, postnasal drip, rhinorrhea, sinus pressure, sneezing, sore throat and trouble swallowing.   Eyes: Negative for redness and itching.  Respiratory: Positive for cough and shortness of breath. Negative for chest tightness and wheezing.   Cardiovascular: Positive for palpitations. Negative for leg swelling.  Gastrointestinal: Negative for nausea and vomiting.  Genitourinary: Negative for dysuria.  Musculoskeletal: Negative for joint swelling.  Skin: Negative for rash.  Allergic/Immunologic: Negative.  Negative for environmental allergies, food allergies and immunocompromised state.  Neurological:  Negative for headaches.  Hematological: Does not bruise/bleed easily.  Psychiatric/Behavioral: Negative for dysphoric mood. The patient is not nervous/anxious.       Objective:   Physical Exam Constitutional:      Appearance: He is obese.  HENT:     Head: Normocephalic.     Right Ear: Tympanic membrane normal.     Nose: Nose normal. No congestion.     Mouth/Throat:     Mouth: Mucous membranes are moist.     Comments: Crowded oropharynx, macroglossia, Mallampati 4 Eyes:     Extraocular Movements: Extraocular movements intact.     Pupils: Pupils are equal, round, and reactive to light.  Cardiovascular:      Rate and Rhythm: Normal rate and regular rhythm.     Pulses: Normal pulses.     Heart sounds: Normal heart sounds. No murmur. No friction rub.  Pulmonary:     Effort: Pulmonary effort is normal. No respiratory distress.     Breath sounds: Normal breath sounds. No stridor. No wheezing or rhonchi.  Musculoskeletal:        General: Normal range of motion.     Cervical back: Normal range of motion and neck supple. No rigidity or tenderness.  Skin:    General: Skin is warm.  Neurological:     General: No focal deficit present.     Mental Status: He is alert.  Psychiatric:        Mood and Affect: Mood normal.    Vitals:   11/19/19 1142  BP: 132/82  Pulse: 100  Temp: (!) 97.3 F (36.3 C)  SpO2: 96%   Results of the Epworth flowsheet 11/19/2019  Sitting and reading 0  Watching TV 2  Sitting, inactive in a public place (e.g. a theatre or a meeting) 1  As a passenger in a car for an hour without a break 0  Lying down to rest in the afternoon when circumstances permit 2  Sitting and talking to someone 0  Sitting quietly after a lunch without alcohol 2  In a car, while stopped for a few minutes in traffic 0  Total score 7      .  Sleep study reviewed showing severe obstructive sleep apnea Assessment & Plan:  .  Severe obstructive sleep apnea  .Marland Kitchen Morbid obesity  .  Daytime sleepiness  .  Daytime fatigue related to recent Covid infection  Pathophysiology of sleep disordered breathing discussed with the patient Treatment options discussed with patient  Patient has a sleep study-titration study scheduled for 14 April  We will initiate CPAP therapy following his study  I will see him back in about 2 months  Encouraged to continue weight loss efforts

## 2019-12-09 ENCOUNTER — Other Ambulatory Visit (HOSPITAL_COMMUNITY)
Admission: RE | Admit: 2019-12-09 | Discharge: 2019-12-09 | Disposition: A | Discharge status: Home / Self Care | Payer: Commercial Managed Care - PPO | Source: Ambulatory Visit | Attending: Pulmonary Disease | Admitting: Pulmonary Disease

## 2019-12-09 ENCOUNTER — Other Ambulatory Visit: Payer: Self-pay

## 2019-12-09 DIAGNOSIS — Z01812 Encounter for preprocedural laboratory examination: Secondary | ICD-10-CM | POA: Diagnosis present

## 2019-12-09 DIAGNOSIS — Z20822 Contact with and (suspected) exposure to covid-19: Secondary | ICD-10-CM | POA: Diagnosis not present

## 2019-12-09 LAB — SARS CORONAVIRUS 2 (TAT 6-24 HRS): SARS Coronavirus 2: NEGATIVE

## 2019-12-11 ENCOUNTER — Other Ambulatory Visit: Payer: Self-pay

## 2019-12-11 ENCOUNTER — Ambulatory Visit: Payer: Commercial Managed Care - PPO | Attending: Pulmonary Disease | Admitting: Pulmonary Disease

## 2019-12-11 DIAGNOSIS — J1282 Pneumonia due to coronavirus disease 2019: Secondary | ICD-10-CM

## 2019-12-11 DIAGNOSIS — G4733 Obstructive sleep apnea (adult) (pediatric): Secondary | ICD-10-CM

## 2019-12-11 DIAGNOSIS — U071 COVID-19: Secondary | ICD-10-CM | POA: Insufficient documentation

## 2019-12-12 ENCOUNTER — Ambulatory Visit (INDEPENDENT_AMBULATORY_CARE_PROVIDER_SITE_OTHER): Payer: Commercial Managed Care - PPO | Admitting: Pulmonary Disease

## 2019-12-12 ENCOUNTER — Encounter: Payer: Self-pay | Admitting: Pulmonary Disease

## 2019-12-12 VITALS — BP 124/82 | HR 88 | Temp 97.8°F | Ht 68.0 in | Wt 301.6 lb

## 2019-12-12 DIAGNOSIS — Z8616 Personal history of COVID-19: Secondary | ICD-10-CM

## 2019-12-12 DIAGNOSIS — J1282 Pneumonia due to coronavirus disease 2019: Secondary | ICD-10-CM | POA: Diagnosis not present

## 2019-12-12 DIAGNOSIS — G4731 Primary central sleep apnea: Secondary | ICD-10-CM

## 2019-12-12 DIAGNOSIS — U071 COVID-19: Secondary | ICD-10-CM | POA: Diagnosis not present

## 2019-12-12 LAB — PULMONARY FUNCTION TEST
DL/VA % pred: 92 %
DL/VA: 4.17 ml/min/mmHg/L
DLCO cor % pred: 71 %
DLCO cor: 19.9 ml/min/mmHg
DLCO unc % pred: 71 %
DLCO unc: 19.9 ml/min/mmHg
FEF 25-75 Post: 3.75 L/sec
FEF 25-75 Pre: 4.68 L/sec
FEF2575-%Change-Post: -19 %
FEF2575-%Pred-Post: 110 %
FEF2575-%Pred-Pre: 138 %
FEV1-%Change-Post: -1 %
FEV1-%Pred-Post: 87 %
FEV1-%Pred-Pre: 88 %
FEV1-Post: 3.26 L
FEV1-Pre: 3.31 L
FEV1FVC-%Change-Post: 0 %
FEV1FVC-%Pred-Pre: 111 %
FEV6-%Change-Post: -1 %
FEV6-%Pred-Post: 80 %
FEV6-%Pred-Pre: 81 %
FEV6-Post: 3.71 L
FEV6-Pre: 3.78 L
FEV6FVC-%Change-Post: 0 %
FEV6FVC-%Pred-Post: 102 %
FEV6FVC-%Pred-Pre: 103 %
FVC-%Change-Post: -1 %
FVC-%Pred-Post: 78 %
FVC-%Pred-Pre: 79 %
FVC-Post: 3.74 L
FVC-Pre: 3.78 L
Post FEV1/FVC ratio: 87 %
Post FEV6/FVC ratio: 99 %
Pre FEV1/FVC ratio: 87 %
Pre FEV6/FVC Ratio: 100 %
RV % pred: 88 %
RV: 1.66 L
TLC % pred: 82 %
TLC: 5.4 L

## 2019-12-12 NOTE — Progress Notes (Signed)
PFT done today. 

## 2019-12-12 NOTE — Progress Notes (Signed)
_0  ID: Henry Miller, male    DOB: [birth year], 49 y.o.   MRN: [id number]  Chief Complaint  Patient presents with  . Follow-up    F/U after PFT. Had a SS last night at [facility]. Has a history of OSA.     Referring provider: [facility]  HPI:  49 year old male former smoker initially referred to our office on 10/07/2010 2021 status post COVID-19 infection in December/2020 requiring hospitalization.  Patient was also later found to have severe obstructive sleep apnea.  PMH: Type 2 diabetes, morbid obesity Smoker/ Smoking History: Former Smoker.  Quit 2007.  5-pack-year smoking history. Maintenance: Symbicort 160 Pt of: Dr. Loanne Drilling for pulmonary, Dr. Jenetta Downer for sleep  12/12/2019  - Visit   49 year old male former smoker initially referred to our office on 10/08/2019.  Patient is status post COVID-19 pneumonia.  Tested positive for Covid on 08/27/2019.  Patient was hospitalized at [facility] for 21 days.  Since being discharged from the hospital he has been maintained on oxygen therapy.  At last office visit with Dr. Loanne Drilling in March/2021 is recommended the patient be referred to a sleep MD, have a CPAP titration as well as continue on Symbicort 160.  Okay for patient to discontinue oxygen as ambulatory walk in office on 11/05/2019 showed no desaturations.  Plan is to repeat CT of chest in 6 months.   Patient presenting to our office today after completing pulmonary function testing.  Those results are listed below:  12/12/2019-pulmonary function test-FVC 3.78 (79% predicted), postbronchodilator ratio 87, postbronchodilator FEV1 3.26 (87% predicted), no bronchodilator response, DLCO 19.9 (71% predicted) >>> Use Symbicort 160 at 6 AM this morning  Patient reports has been doing well since last being seen.  He continues to utilize his Symbicort 160 inhaler.  He use this prior to the PFT today.  Patient completed a BiPAP titration study last night.  He is still awaiting those  results.  Patient is also followed up with primary care.  He received good news that his A1c is down from 9.9 to 6.6.  Overall patient is doing well with no acute or worsening complaints.    Questionaires / Pulmonary Flowsheets:   MMRC: mMRC Dyspnea Scale mMRC Score  12/12/2019 1   Epworth:  Results of the Epworth flowsheet 11/19/2019  Sitting and reading 0  Watching TV 2  Sitting, inactive in a public place (e.g. a theatre or a meeting) 1  As a passenger in a car for an hour without a break 0  Lying down to rest in the afternoon when circumstances permit 2  Sitting and talking to someone 0  Sitting quietly after a lunch without alcohol 2  In a car, while stopped for a few minutes in traffic 0  Total score 7    Tests:   Imaging: CT 1/07: Increased bilateral ground glass opacities compared to prior CT 12/29.  CT 12/29: diffuse bilateral ground glass opacities. No PE. LE Vas [place] 1/06: negative for DVT CT Chest 10/29/19 - Resolving bilateral patchy ground glass consolidation  Home sleep study 10/27/19 AHI 39.3 with complex sleep apnea. Recommend PAP titration  FENO:  No results found for: NITRICOXIDE  PFT: PFT Results Latest Ref Rng & Units 12/12/2019  FVC-Pre L 3.78  FVC-Predicted Pre % 79  FVC-Post L 3.74  FVC-Predicted Post % 78  Pre FEV1/FVC % % 87  Post FEV1/FCV % % 87  FEV1-Pre L 3.31  FEV1-Predicted Pre % 88  FEV1-Post L  3.26  DLCO UNC% % 71  DLCO COR %Predicted % 92  TLC L 5.40  TLC % Predicted % 82  RV % Predicted % 88    WALK:  SIX MIN WALK 11/05/2019 10/08/2019  Supplimental Oxygen during Test? (L/min) No Yes  Type - Continuous  Tech Comments: Patient walked at a fast pace, no oxygen required, sats WNL on room air. patient had to add 2L oxygen during lap 1 for a sat of 86%.patient completed rest of lap 1 and 2 on 2L oxygen , did well walked at a slow to moderate pace, no complaints during walk.    Imaging: No results found.  Lab Results:  CBC      Component Value Date/Time   WBC 4.5 09/17/2019 0507   RBC 4.67 09/17/2019 0507   HGB 14.5 09/17/2019 0507   HCT 41.5 09/17/2019 0507   PLT 121 (L) 09/17/2019 0507   MCV 88.9 09/17/2019 0507   MCH 31.0 09/17/2019 0507   MCHC 34.9 09/17/2019 0507   RDW 14.7 09/17/2019 0507   LYMPHSABS 1.2 09/17/2019 0507   MONOABS 0.4 09/17/2019 0507   EOSABS 0.7 (H) 09/17/2019 0507   BASOSABS 0.0 09/17/2019 0507    BMET    Component Value Date/Time   NA 137 10/21/2019 1530   K 4.5 10/21/2019 1530   CL 101 10/21/2019 1530   CO2 26 10/21/2019 1530   GLUCOSE 154 (H) 10/21/2019 1530   BUN 10 10/21/2019 1530   CREATININE 0.71 10/21/2019 1530   CALCIUM 10.2 10/21/2019 1530   GFRNONAA >60 09/17/2019 0507   GFRAA >60 09/17/2019 0507    BNP    Component Value Date/Time   BNP 156.8 (H) 09/11/2019 0417    ProBNP No results found for: PROBNP  Specialty Problems      Pulmonary Problems   Complex sleep apnea syndrome      No Known Allergies   There is no immunization history on file for this patient.  Past Medical History:  Diagnosis Date  . Pancreatitis   . Type 2 diabetes mellitus ([facility])     Tobacco History: Social History   Tobacco Use  Smoking Status Former Smoker  . Packs/day: 1.00  . Years: 5.00  . Pack years: 5.00  . Types: Cigarettes  . Quit date: 09/2005  . Years since quitting: 14.2  Smokeless Tobacco Former Systems developer  . Types: Snuff   Counseling given: Yes   Continue to not smoke  Outpatient Encounter Medications as of 12/12/2019  Medication Sig  . albuterol (VENTOLIN HFA) 108 (90 Base) MCG/ACT inhaler Inhale 2 puffs into the lungs every 6 (six) hours as needed for wheezing or shortness of breath.  . bismuth subsalicylate (PEPTO BISMOL) 262 MG chewable tablet Chew 524 mg by mouth once as needed for diarrhea or loose stools.  . blood glucose meter kit and supplies KIT Dispense based on patient and insurance preference. Use up to four times daily as directed. (FOR  ICD-9 250.00, 250.01).  . budesonide-formoterol (SYMBICORT) 160-4.5 MCG/ACT inhaler Inhale 2 puffs into the lungs 2 (two) times daily.  . fluticasone (FLONASE) 50 MCG/ACT nasal spray Place 2 sprays into both nostrils daily.  . metFORMIN (GLUCOPHAGE) 1000 MG tablet Take 1,000 mg by mouth 2 (two) times daily.  . mupirocin ointment (BACTROBAN) 2 % Place 1 application into the nose 2 (two) times daily.  Marland Kitchen linagliptin (TRADJENTA) 5 MG TABS tablet Take 1 tablet (5 mg total) by mouth daily.   No facility-administered encounter medications on  file as of 12/12/2019.     Review of Systems  Review of Systems  Constitutional: Negative for activity change, chills, fatigue, fever and unexpected weight change.  HENT: Negative for postnasal drip, rhinorrhea, sinus pressure, sinus pain and sore throat.   Eyes: Negative.   Respiratory: Negative for cough, shortness of breath and wheezing.   Cardiovascular: Negative for chest pain and palpitations.  Gastrointestinal: Negative for constipation, diarrhea, nausea and vomiting.  Endocrine: Negative.   Genitourinary: Negative.   Musculoskeletal: Negative.   Skin: Negative.   Neurological: Negative for dizziness and headaches.  Psychiatric/Behavioral: Negative.  Negative for dysphoric mood. The patient is not nervous/anxious.   All other systems reviewed and are negative.    Physical Exam  BP 124/82   Pulse 88   Temp 97.8 F (36.6 C) (Temporal)   Ht _0  (1.727 m)   Wt (!) 301 lb 9.6 oz (136.8 kg)   SpO2 98% Comment: on RA  BMI 45.86 kg/m   Wt Readings from Last 5 Encounters:  12/12/19 (!) 301 lb 9.6 oz (136.8 kg)  11/19/19 296 lb 9.6 oz (134.5 kg)  11/05/19 291 lb 3.2 oz (132.1 kg)  10/08/19 278 lb 6.4 oz (126.3 kg)  09/16/19 266 lb 15.6 oz (121.1 kg)    BMI Readings from Last 5 Encounters:  12/12/19 45.86 kg/m  11/19/19 42.56 kg/m  11/05/19 41.78 kg/m  10/08/19 39.95 kg/m  09/16/19 38.31 kg/m     Physical Exam Vitals and  nursing note reviewed.  Constitutional:      General: He is not in acute distress.    Appearance: Normal appearance. He is obese.  HENT:     Head: Normocephalic and atraumatic.     Right Ear: Hearing, tympanic membrane, ear canal and external ear normal.     Left Ear: Hearing, tympanic membrane, ear canal and external ear normal.     Nose: Nose normal. No mucosal edema or rhinorrhea.     Right Turbinates: Not enlarged.     Left Turbinates: Not enlarged.     Mouth/Throat:     Mouth: Mucous membranes are dry.     Pharynx: Oropharynx is clear. No oropharyngeal exudate.  Eyes:     Pupils: Pupils are equal, round, and reactive to light.  Cardiovascular:     Rate and Rhythm: Normal rate and regular rhythm.     Pulses: Normal pulses.     Heart sounds: Normal heart sounds. No murmur.  Pulmonary:     Effort: Pulmonary effort is normal.     Breath sounds: Normal breath sounds. No decreased breath sounds, wheezing or rales.  Musculoskeletal:     Cervical back: Normal range of motion.     Right lower leg: No edema.     Left lower leg: No edema.  Lymphadenopathy:     Cervical: No cervical adenopathy.  Skin:    General: Skin is warm and dry.     Capillary Refill: Capillary refill takes less than 2 seconds.     Findings: No erythema or rash.  Neurological:     General: No focal deficit present.     Mental Status: He is alert and oriented to person, place, and time.     Motor: No weakness.     Coordination: Coordination normal.     Gait: Gait is intact. Gait normal.  Psychiatric:        Mood and Affect: Mood normal.        Behavior: Behavior normal. Behavior is cooperative.  Thought Content: Thought content normal.        Judgment: Judgment normal.       Assessment & Plan:   Complex sleep apnea syndrome Completed BiPAP titration study last night Is awaiting those results  Plan: Keep follow-up with Dr. Jenetta Downer in office Our office will contact you regarding your BiPAP  titration results when they are formally read   History of COVID-19 Reviewed pulmonary function testing today  Plan: Continue Symbicort 160 Complete repeat CT chest in September/2021 as planned by Dr. Loanne Drilling May need to consider trial of stopping inhaler or decreasing to Symbicort 80 at that time if patient symptoms continue to be at baseline or improving Continue to work on increasing your physical activity Notify our office with any acute or worsening concerns    Return in about 4 months (around 04/24/2020), or if symptoms worsen or fail to improve, for Follow up with Dr. Loanne Drilling.   Lauraine Rinne, NP 12/12/2019   This appointment required 32 minutes of patient care (this includes precharting, chart review, review of results, face-to-face care, etc.).

## 2019-12-12 NOTE — Patient Instructions (Signed)
You were seen today by Coral Ceo, NP  for:   1. History of COVID-19  Continue Symbicort 160 >>> 2 puffs in the morning right when you wake up, rinse out your mouth after use, 12 hours later 2 puffs, rinse after use >>> Take this daily, no matter what >>> This is not a rescue inhaler   Continue forward with planned CT in September/2021 as scheduled  May need to consider decreasing inhaler or stopping inhaler after next CT  Continue to work on increasing physical activity  2. Complex sleep apnea syndrome  Our office will contact you regarding starting BiPAP therapy    Follow Up:    No follow-ups on file.   Please do your part to reduce the spread of COVID-19:      Reduce your risk of any infection  and COVID19 by using the similar precautions used for avoiding the common cold or flu:  Marland Kitchen Wash your hands often with soap and warm water for at least 20 seconds.  If soap and water are not readily available, use an alcohol-based hand sanitizer with at least 60% alcohol.  . If coughing or sneezing, cover your mouth and nose by coughing or sneezing into the elbow areas of your shirt or coat, into a tissue or into your sleeve (not your hands). Drinda Butts A MASK when in public  . Avoid shaking hands with others and consider head nods or verbal greetings only. . Avoid touching your eyes, nose, or mouth with unwashed hands.  . Avoid close contact with people who are sick. . Avoid places or events with large numbers of people in one location, like concerts or sporting events. . If you have some symptoms but not all symptoms, continue to monitor at home and seek medical attention if your symptoms worsen. . If you are having a medical emergency, call 911.   ADDITIONAL HEALTHCARE OPTIONS FOR PATIENTS  [place] [facility] / e-Visit: [email or web address]         [facility]: [phone]  [facility]: [phone]                    [facility]: [phone]     It is flu season:   >>> Best ways to protect herself from the flu: Receive the yearly flu vaccine, practice good hand hygiene washing with soap and also using hand sanitizer when available, eat a nutritious meals, get adequate rest, hydrate appropriately   Please contact the office if your symptoms worsen or you have concerns that you are not improving.   Thank you for choosing [facility] for your healthcare, and for allowing [place] to partner with you on your healthcare journey. I am thankful to be able to provide care to you today.   Elisha Headland FNP-C

## 2019-12-12 NOTE — Assessment & Plan Note (Signed)
Reviewed pulmonary function testing today  Plan: Continue Symbicort 160 Complete repeat CT chest in September/2021 as planned by Dr. Everardo All May need to consider trial of stopping inhaler or decreasing to Symbicort 80 at that time if patient symptoms continue to be at baseline or improving Continue to work on increasing your physical activity Notify our office with any acute or worsening concerns

## 2019-12-12 NOTE — Assessment & Plan Note (Signed)
Completed BiPAP titration study last night Is awaiting those results  Plan: Keep follow-up with Dr. Val Eagle in office Our office will contact you regarding your BiPAP titration results when they are formally read

## 2019-12-15 ENCOUNTER — Telehealth: Payer: Self-pay | Admitting: Pulmonary Disease

## 2019-12-15 DIAGNOSIS — G4733 Obstructive sleep apnea (adult) (pediatric): Secondary | ICD-10-CM

## 2019-12-15 NOTE — Telephone Encounter (Signed)
Sleep study report  Date of study 12/11/2019  Impression: Moderate obstructive sleep apnea Adequate treatment achieved with BiPAP  Recommendation: Trial of BiPAP therapy on 17/13 cm H2O with a Large size Fisher&Paykel Full Face Mask Simplus mask and heated humidification.  Follow-up in the office in 4 to 6 weeks following initiation of BiPAP treatment for optimization of treatment

## 2019-12-15 NOTE — Procedures (Signed)
POLYSOMNOGRAPHY  Last, First: Henry, Miller MRN: [id number] Gender: Male Age (years): 49 Weight (lbs): 301 DOB: Feb 09, 1971 BMI: 46 Primary Care: No PCP Epworth Score: 6 Referring: Chi Rodman Pickle Technician: Peak, Robert Interpreting: Laurin Coder MD Study Type: Split Night BiPAP Ordered Study Type: CPAP Study date: 12/11/2019 Location: [facility] CLINICAL INFORMATION Henry Miller is a 49 year old Male and was referred to the sleep center for evaluation of N/A. Indications include N/A.  MEDICATIONS Patient self administered medications include: N/A. Medications administered during study include No sleep medicine administered.  SLEEP STUDY TECHNIQUE The patient underwent an attended overnight level one polysomnography titration to assess the effects of BIPAP therapy. The following variables were monitored: EEG (C4-A1, C3-A2, O1-A2, O2-A1), EOG, submental and leg EMG, ECG, oxyhemoglobin saturation by pulse oximetry, thoracic and abdominal respiratory effort belts, nasal/oral airflow by pressure sensor, body position sensor and snoring sensor. BIPAP pressure was titrated to eliminate apneas, hypopneas and oxygen desaturation.Hypopneas were scored per AASM definition IB (4% desaturation)  The NPSG portion of the study ended at 4:51:55 AM. The BiPAP titration was initiated at N/A AM with the BIPAP portion of the study ending at N/A.  TECHNICIAN COMMENTS Comments added by Technician: UNCONTROLLED SLEEP DISORDER BREATHING WHILE USING CPAP, PATIENT WAS SWITCHED TO BILEVEL. Comments added by Scorer: N/A SLEEP ARCHITECTURE The recording time for the entire night was 404.4 minutes.  The diagnostic portion was initiated at 10:07:33 PM and terminated at 4:51:55 AM. The time in bed was 404.4 minutes. EEG confirmed total sleep time was 345.0 minutes yielding a sleep efficiency of 85.3%%. Sleep onset after lights out was 6.3 minutes with a REM latency of 119.0 minutes. The patient spent  4.9%% of the night in stage N1 sleep, 79.9% % in stage N2 sleep, 3.8%% in stage N3 and 11.4% in REM. The Arousal Index was 9.6/hour.  The titration portion was initiated at N/A and terminated at N/A. The time in bed was 0.0 minutes. EEG confirmed total sleep time was 0.0 minutes yielding a sleep efficiency of N/A%. Sleep onset after BiPAP initiation was N/A minutes with a REM latency of N/A minutes. The patient spent N/A% of the night in stage N1 sleep, N/A% in stage N2 sleep, N/A% in stage N3 and 0% in REM. The Arousal Index was N/A/hour. RESPIRATORY PARAMETERS During the diagnostic portion, there were a total of 109 respiratory disturbances recorded; 19 apneas ( 0 obstructive, 0 mixed, 19 central), 68 hypopneas and 22 RERAs. The apnea/hypopnea index 15.1 was events/hour and the RDI was 19.0 events/hour. The central sleep apnea index was 3.3 events/hour. The REM AHI was 19.7/h and NREM AHI was 14.7/h. The REM RDI was 21.3/h and NREM RDI was 18.9/h. The supine AHI was N/A/h, and the non supine AHI was 15.1/h; supine during 0.0%% of sleep. The supine RDI was 0.0/h, and the non supine RDI was 18.96/h. Respiratory disturbances were associated with oxygen desaturation down to a nadir of 87.0 % during sleep. The mean oxygen saturation during the study was 94.0%. The cumulative time under 88% oxygen saturation was 0.3 minutes.  During the titration portion, the apnea/hypopnea index (AHI) was N/A events/hour and the RDI was 0.0 events/hour. The central sleep apnea index was events/hour. The most appropriate setting of BiPAP was IPAP/EPAP 17/13 cm H2O. At this setting, the sleep efficiency was 86% and the patient was supine for 0%. The AHI was 3.3 events per hour(with 0 central events). Oxygen nadir was 91.0 . LEG MOVEMENT DATA The periodic limb  movement index was 0.0/hour with an associated arousal index of /hour. CARDIAC DATA The underlying cardiac rhythm was most consistent with sinus rhythm. Mean heart rate  was 91.6 during diagnostic portion and N/A during titration portion of study. Additional rhythm abnormalities include None.   IMPRESSIONS EKG showed no cardiac abnormalities. No snoring was audible during this study. Moderate Obstructive Sleep apnea(OSA) Optimal pressure attained. No Significant Central Sleep Apnea (CSA) Mild Oxygen Desaturation No significant periodic leg movements(PLMs) during sleep. However, no significant associated arousals. Normal sleep efficiency, normal primary sleep latency, normal REM sleep latency and short slow wave latency.   DIAGNOSIS Obstructive Sleep Apnea (327.23 [G47.33 ICD-10])   RECOMMENDATIONS Trial of BiPAP therapy on 17/13 cm H2O with a Large size Fisher&Paykel Full Face Mask Simplus mask and heated humidification. Avoid alcohol, sedatives and other CNS depressants that may worsen sleep apnea and disrupt normal sleep architecture. Sleep hygiene should be reviewed to assess factors that may improve sleep quality. Weight management and regular exercise should be initiated or continued. Return to Sleep Center for re-evaluation.  [Electronically signed] 12/15/2019 06:08 PM  Virl Diamond MD NPI: [id number]

## 2019-12-16 NOTE — Progress Notes (Signed)
LR - please let patient know his sleep study has been read and the following changes are recommended. Please order the following BiPAP supplies and settings for patient:  BiPAP therapy on 17/13 cm H2O with a Large size Fisher&Paykel Full Face Mask Simplus mask and heated humidification.  Mechele Collin, M.D. [facility] 12/16/2019 3:25 PM

## 2019-12-16 NOTE — Telephone Encounter (Signed)
ATC Patient.  LM for Patient to call for sleep results. 

## 2019-12-18 NOTE — Telephone Encounter (Signed)
ATC Patient.  LM to call back for sleep results. 

## 2019-12-19 NOTE — Telephone Encounter (Signed)
Pt called back for sleep study results please return call  beofore 5pm today 4/22 or after 2pm 4/23

## 2019-12-20 NOTE — Telephone Encounter (Signed)
Called and spoke with patient.  Dr Trena Platt results and recommendations given.  Understanding stated.  Patient aware of needed follow up appointment for insurance compliance.  DME order placed.  Nothing further at this time.

## 2019-12-31 ENCOUNTER — Encounter: Payer: Self-pay | Admitting: Pulmonary Disease

## 2019-12-31 ENCOUNTER — Ambulatory Visit (INDEPENDENT_AMBULATORY_CARE_PROVIDER_SITE_OTHER): Payer: Commercial Managed Care - PPO | Admitting: Pulmonary Disease

## 2019-12-31 ENCOUNTER — Other Ambulatory Visit: Payer: Self-pay

## 2019-12-31 VITALS — BP 140/84 | HR 104 | Temp 97.9°F | Ht 68.25 in | Wt 309.6 lb

## 2019-12-31 DIAGNOSIS — G4733 Obstructive sleep apnea (adult) (pediatric): Secondary | ICD-10-CM

## 2019-12-31 NOTE — Patient Instructions (Signed)
BiPAP 17/13  Call us with any concerns Call us if it is not well-tolerated  Continue graded exercises  Continue physical activity as tolerated  We will follow-up with you in about 6 to 8 weeks

## 2019-12-31 NOTE — Progress Notes (Signed)
Subjective:    Patient ID: Henry Miller, male    DOB: [birth year], 49 y.o.   MRN: [id number]  Patient with a history of obstructive sleep apnea  Recent home sleep study did reveal severe obstructive sleep apnea with desaturations Titration study did reveal titrations to pressure of 17/13 BiPAP is about to be set up  Admits to dryness of his mouth in the mornings No headaches Admits to sweating at night No nocturia No family history of obstructive sleep apnea  Works third shift takes him about 10 to 15 minutes to fall asleep 2-3 awakenings  Variability in his sleep and wake times depending on work schedule  Has managed to lose about 70-100 pounds lately-watching his diet and exercising regularly   Recently had Covid infection-exercise level is improving Activity level improving  Past Medical History:  Diagnosis Date  . Pancreatitis   . Type 2 diabetes mellitus (HCC)    Social History   Socioeconomic History  . Marital status: Married    Spouse name: Not on file  . Number of children: Not on file  . Years of education: Not on file  . Highest education level: Not on file  Occupational History  . Not on file  Tobacco Use  . Smoking status: Former Smoker    Packs/day: 1.00    Years: 5.00    Pack years: 5.00    Types: Cigarettes    Quit date: 09/2005    Years since quitting: 14.2  . Smokeless tobacco: Former Neurosurgeon    Types: Snuff  Substance and Sexual Activity  . Alcohol use: No  . Drug use: No  . Sexual activity: Not on file  Other Topics Concern  . Not on file  Social History Narrative  . Not on file   Social Determinants of Health   Financial Resource Strain:   . Difficulty of Paying Living Expenses:   Food Insecurity:   . Worried About Programme researcher, broadcasting/film/video in the Last Year:   . Barista in the Last Year:   Transportation Needs:   . Freight forwarder (Medical):   Marland Kitchen Lack of Transportation (Non-Medical):   Physical Activity:   . Days of  Exercise per Week:   . Minutes of Exercise per Session:   Stress:   . Feeling of Stress :   Social Connections:   . Frequency of Communication with Friends and Family:   . Frequency of Social Gatherings with Friends and Family:   . Attends Religious Services:   . Active Member of Clubs or Organizations:   . Attends Banker Meetings:   Marland Kitchen Marital Status:   Intimate Partner Violence:   . Fear of Current or Ex-Partner:   . Emotionally Abused:   Marland Kitchen Physically Abused:   . Sexually Abused:    Family History  Problem Relation Age of Onset  . Diabetes Father   . Diabetes Maternal Grandmother    Review of Systems  Constitutional: Positive for unexpected weight change. Negative for fever.  HENT: Positive for congestion and dental problem. Negative for ear pain, nosebleeds, postnasal drip, rhinorrhea, sinus pressure, sneezing, sore throat and trouble swallowing.   Eyes: Negative for redness and itching.  Respiratory: Positive for shortness of breath. Negative for cough, chest tightness and wheezing.   Cardiovascular: Negative for palpitations and leg swelling.  Gastrointestinal: Negative for nausea and vomiting.  Genitourinary: Negative for dysuria.  Musculoskeletal: Negative for joint swelling.  Skin: Negative for rash.  Allergic/Immunologic:  Negative.  Negative for environmental allergies, food allergies and immunocompromised state.  Neurological: Negative for headaches.  Hematological: Does not bruise/bleed easily.  Psychiatric/Behavioral: Negative for dysphoric mood. The patient is not nervous/anxious.       Objective:   Physical Exam Constitutional:      Appearance: He is obese.  HENT:     Head: Normocephalic.     Right Ear: Tympanic membrane normal.     Mouth/Throat:     Mouth: Mucous membranes are moist.     Comments: Crowded oropharynx, macroglossia, Mallampati 4 Eyes:     Extraocular Movements: Extraocular movements intact.     Pupils: Pupils are equal,  round, and reactive to light.  Cardiovascular:     Rate and Rhythm: Normal rate and regular rhythm.     Pulses: Normal pulses.     Heart sounds: Normal heart sounds. No murmur. No friction rub.  Pulmonary:     Effort: Pulmonary effort is normal. No respiratory distress.     Breath sounds: Normal breath sounds. No stridor. No wheezing or rhonchi.  Musculoskeletal:     Cervical back: Normal range of motion and neck supple. No rigidity or tenderness.  Neurological:     Mental Status: He is alert.    Vitals:   12/31/19 1000  BP: 140/84  Pulse: (!) 104  Temp: 97.9 F (36.6 C)  SpO2: 97%   Results of the Epworth flowsheet 11/19/2019  Sitting and reading 0  Watching TV 2  Sitting, inactive in a public place (e.g. a theatre or a meeting) 1  As a passenger in a car for an hour without a break 0  Lying down to rest in the afternoon when circumstances permit 2  Sitting and talking to someone 0  Sitting quietly after a lunch without alcohol 2  In a car, while stopped for a few minutes in traffic 0  Total score 7      .  Sleep study reviewed showing severe obstructive sleep apnea .  Titration study reviewed showing effective pressure of 17/13 Assessment & Plan:  .  Severe obstructive sleep apnea -We will be starting BiPAP therapy  .Marland Kitchen Morbid obesity -He continues to work on weight loss efforts  .  Daytime sleepiness -This should improve with treatment of sleep disordered breathing  .  Daytime fatigue related to recent Covid infection  Pathophysiology of sleep disordered breathing discussed with the patient Treatment options discussed with patient   I will see him back in about 6 to 8 weeks with compliance download  Encouraged to continue weight loss efforts Graded exercise as tolerated Call with any significant concerns

## 2020-02-11 ENCOUNTER — Other Ambulatory Visit: Payer: Self-pay

## 2020-02-11 ENCOUNTER — Ambulatory Visit (INDEPENDENT_AMBULATORY_CARE_PROVIDER_SITE_OTHER): Payer: Commercial Managed Care - PPO | Admitting: Pulmonary Disease

## 2020-02-11 ENCOUNTER — Encounter: Payer: Self-pay | Admitting: Pulmonary Disease

## 2020-02-11 VITALS — BP 126/78 | HR 100 | Temp 98.7°F | Ht 69.0 in | Wt 315.0 lb

## 2020-02-11 DIAGNOSIS — Z9989 Dependence on other enabling machines and devices: Secondary | ICD-10-CM | POA: Diagnosis not present

## 2020-02-11 DIAGNOSIS — G4733 Obstructive sleep apnea (adult) (pediatric): Secondary | ICD-10-CM

## 2020-02-11 NOTE — Patient Instructions (Signed)
BiPAP is working well  Continue using your BiPAP on a regular basis Continue weight loss efforts  Follow-up in about 6 months  Call with significant concerns

## 2020-02-11 NOTE — Progress Notes (Signed)
Subjective:    Patient ID: Henry Miller, male    DOB: [birth year], 49 y.o.   MRN: [id number]  Patient with a history of obstructive sleep apnea  Recent home sleep study did reveal severe obstructive sleep apnea with desaturations Titration study did reveal titrations to pressure of 17/13 Been using the new BiPAP for about a month now 100% compliance Average hours of sleep of 7 hours Respiratory chart 0.5  Feeling much better Functioning better  Historically Admits to dryness of his mouth in the mornings No headaches Admits to sweating at night No nocturia No family history of obstructive sleep apnea  Works third shift takes him about 10 to 15 minutes to fall asleep 2-3 awakenings  Variability in his sleep and wake times depending on work schedule  Has managed to lose about 70-100 pounds lately-watching his diet and exercising regularly   Activity level generally continues to improve  Past Medical History:  Diagnosis Date  . Pancreatitis   . Type 2 diabetes mellitus (HCC)    Social History   Socioeconomic History  . Marital status: Married    Spouse name: Not on file  . Number of children: Not on file  . Years of education: Not on file  . Highest education level: Not on file  Occupational History  . Not on file  Tobacco Use  . Smoking status: Former Smoker    Packs/day: 1.00    Years: 5.00    Pack years: 5.00    Types: Cigarettes    Quit date: 09/2005    Years since quitting: 14.3  . Smokeless tobacco: Former Neurosurgeon    Types: Snuff  Substance and Sexual Activity  . Alcohol use: No  . Drug use: No  . Sexual activity: Not on file  Other Topics Concern  . Not on file  Social History Narrative  . Not on file   Social Determinants of Health   Financial Resource Strain:   . Difficulty of Paying Living Expenses:   Food Insecurity:   . Worried About Programme researcher, broadcasting/film/video in the Last Year:   . Barista in the Last Year:   Transportation Needs:   .  Freight forwarder (Medical):   Marland Kitchen Lack of Transportation (Non-Medical):   Physical Activity:   . Days of Exercise per Week:   . Minutes of Exercise per Session:   Stress:   . Feeling of Stress :   Social Connections:   . Frequency of Communication with Friends and Family:   . Frequency of Social Gatherings with Friends and Family:   . Attends Religious Services:   . Active Member of Clubs or Organizations:   . Attends Banker Meetings:   Marland Kitchen Marital Status:   Intimate Partner Violence:   . Fear of Current or Ex-Partner:   . Emotionally Abused:   Marland Kitchen Physically Abused:   . Sexually Abused:    Family History  Problem Relation Age of Onset  . Diabetes Father   . Diabetes Maternal Grandmother    Review of Systems  Constitutional: Negative for fever and unexpected weight change.  HENT: Negative for congestion, dental problem, ear pain, nosebleeds, postnasal drip, rhinorrhea, sinus pressure, sneezing, sore throat and trouble swallowing.   Eyes: Negative for redness and itching.  Respiratory: Positive for shortness of breath. Negative for cough, chest tightness and wheezing.   Cardiovascular: Negative for palpitations and leg swelling.  Gastrointestinal: Negative for nausea and vomiting.  Genitourinary: Negative for  dysuria.  Musculoskeletal: Negative for joint swelling.  Skin: Negative for rash.  Allergic/Immunologic: Negative.  Negative for environmental allergies, food allergies and immunocompromised state.  Neurological: Negative for headaches.  Hematological: Does not bruise/bleed easily.  Psychiatric/Behavioral: Negative for dysphoric mood. The patient is not nervous/anxious.       Objective:   Physical Exam Constitutional:      Appearance: He is obese.  HENT:     Head: Normocephalic.     Mouth/Throat:     Mouth: Mucous membranes are moist.     Comments: Crowded oropharynx, macroglossia, Mallampati 4 Eyes:     Extraocular Movements: Extraocular movements  intact.     Pupils: Pupils are equal, round, and reactive to light.  Cardiovascular:     Rate and Rhythm: Normal rate and regular rhythm.     Pulses: Normal pulses.     Heart sounds: Normal heart sounds. No murmur heard.  No friction rub.  Pulmonary:     Effort: Pulmonary effort is normal. No respiratory distress.     Breath sounds: Normal breath sounds. No stridor. No wheezing or rhonchi.  Musculoskeletal:     Cervical back: No rigidity or tenderness.  Neurological:     Mental Status: He is alert.    Vitals:   02/11/20 0945  BP: 126/78  Pulse: 100  Temp: 98.7 F (37.1 C)  SpO2: 97%   Results of the Epworth flowsheet 11/19/2019  Sitting and reading 0  Watching TV 2  Sitting, inactive in a public place (e.g. a theatre or a meeting) 1  As a passenger in a car for an hour without a break 0  Lying down to rest in the afternoon when circumstances permit 2  Sitting and talking to someone 0  Sitting quietly after a lunch without alcohol 2  In a car, while stopped for a few minutes in traffic 0  Total score 7      .  Sleep study reviewed showing severe obstructive sleep apnea .  Titration study reviewed showing effective pressure of 17/13  Compliance data reviewed showing 100% compliance Average use of 7 hours 7 minutes Machine is set at 17/13 Residual AHI of 0.5 Some issues with leak-patient states he has been he has a stubble going   Assessment & Plan:  .  Severe obstructive sleep apnea -Well-controlled with BiPAP 17/13  .Marland Kitchen Morbid obesity -He continues to work on weight loss efforts  .  Daytime sleepiness -Much better with BiPAP  .  Daytime fatigue related to recent Covid infection -This continues to improve   With excellent compliance and historically excellent compliance with BiPAP  We will follow-up in 6 months  Encouraged to continue weight loss efforts Graded exercise as tolerated Call with any significant concerns

## 2020-05-07 ENCOUNTER — Ambulatory Visit (HOSPITAL_COMMUNITY): Payer: Commercial Managed Care - PPO

## 2020-05-13 ENCOUNTER — Ambulatory Visit (HOSPITAL_COMMUNITY)
Admission: RE | Admit: 2020-05-13 | Discharge: 2020-05-13 | Disposition: A | Discharge status: Home / Self Care | Payer: Commercial Managed Care - PPO | Source: Ambulatory Visit | Attending: Pulmonary Disease | Admitting: Pulmonary Disease

## 2020-05-13 ENCOUNTER — Other Ambulatory Visit: Payer: Self-pay

## 2020-05-13 DIAGNOSIS — U071 COVID-19: Secondary | ICD-10-CM | POA: Insufficient documentation

## 2020-05-13 DIAGNOSIS — J1282 Pneumonia due to coronavirus disease 2019: Secondary | ICD-10-CM | POA: Insufficient documentation

## 2020-05-28 ENCOUNTER — Telehealth: Payer: Self-pay | Admitting: Pulmonary Disease

## 2020-05-28 DIAGNOSIS — J1282 Pneumonia due to coronavirus disease 2019: Secondary | ICD-10-CM

## 2020-05-28 DIAGNOSIS — U071 COVID-19: Secondary | ICD-10-CM

## 2020-05-28 DIAGNOSIS — J9601 Acute respiratory failure with hypoxia: Secondary | ICD-10-CM

## 2020-05-28 NOTE — Telephone Encounter (Signed)
Called and spoke with patient about CT results from Dr. Everardo All and let him know that he needs to repeat PFT in April 2022. Patient expressed understanding. He wants to know if he still needs to stay on the Symbicort and the steroid messes with diabetes. States him and Arlys John discussed this at his last appointment.   Dr. Everardo All please advise

## 2020-05-28 NOTE — Telephone Encounter (Signed)
Called and spoke with pt in regards to his call from Dr. Everardo All. Stated to pt that we would put a recall in for PFT and OV April 2022 and he verbalized understanding. Asked pt if he had received covid vaccine yet and he stated not yet but after discussion with Dr. Everardo All, he said he is going to schedule appt to receive vaccine. Stated to pt that he would need to bring vaccine card with him to PFT appt and he verbalized understanding. Nothing further needed.

## 2020-05-28 NOTE — Telephone Encounter (Signed)
Called patient. OK to discontinue Symbicort. Please schedule to follow-up with me after PFTs.  Mechele Collin, M.D. [facility] 05/28/2020 2:32 PM

## 2020-07-30 ENCOUNTER — Encounter: Payer: Self-pay | Admitting: Pulmonary Disease

## 2020-07-30 ENCOUNTER — Ambulatory Visit (INDEPENDENT_AMBULATORY_CARE_PROVIDER_SITE_OTHER): Payer: Commercial Managed Care - PPO | Admitting: Pulmonary Disease

## 2020-07-30 ENCOUNTER — Other Ambulatory Visit: Payer: Self-pay

## 2020-07-30 VITALS — BP 118/74 | HR 95 | Temp 97.5°F | Ht 68.0 in | Wt 323.2 lb

## 2020-07-30 DIAGNOSIS — G4733 Obstructive sleep apnea (adult) (pediatric): Secondary | ICD-10-CM

## 2020-07-30 DIAGNOSIS — Z9989 Dependence on other enabling machines and devices: Secondary | ICD-10-CM

## 2020-07-30 NOTE — Progress Notes (Signed)
Subjective:    Patient ID: Henry Miller, male    DOB: [birth year], 49 y.o.   MRN: [id number]  Patient with a history of obstructive sleep apnea  Using BiPAP on a regular basis Feels the pressure may still be a little bit high Tolerating it well Continues to benefit from them  Feels generally better with BiPAP use  Current pressure setting is 17/13 He does have mask leaks on occasions This sometimes bothers his spouse   Historically Admits to dryness of his mouth in the mornings No headaches Admits to sweating at night No nocturia No family history of obstructive sleep apnea  Works third shift takes him about 10 to 15 minutes to fall asleep 2-3 awakenings  Variability in his sleep and wake times depending on work schedule  Has managed to lose about 70-100 pounds lately-watching his diet and exercising regularly   Activity level generally continues to improve  Past Medical History:  Diagnosis Date  . Pancreatitis   . Type 2 diabetes mellitus (HCC)    Social History   Socioeconomic History  . Marital status: Married    Spouse name: Not on file  . Number of children: Not on file  . Years of education: Not on file  . Highest education level: Not on file  Occupational History  . Not on file  Tobacco Use  . Smoking status: Former Smoker    Packs/day: 1.00    Years: 5.00    Pack years: 5.00    Types: Cigarettes    Quit date: 09/2005    Years since quitting: 14.8  . Smokeless tobacco: Former Neurosurgeon    Types: Snuff  Substance and Sexual Activity  . Alcohol use: No  . Drug use: No  . Sexual activity: Not on file  Other Topics Concern  . Not on file  Social History Narrative  . Not on file   Social Determinants of Health   Financial Resource Strain:   . Difficulty of Paying Living Expenses: Not on file  Food Insecurity:   . Worried About Programme researcher, broadcasting/film/video in the Last Year: Not on file  . Ran Out of Food in the Last Year: Not on file  Transportation  Needs:   . Lack of Transportation (Medical): Not on file  . Lack of Transportation (Non-Medical): Not on file  Physical Activity:   . Days of Exercise per Week: Not on file  . Minutes of Exercise per Session: Not on file  Stress:   . Feeling of Stress : Not on file  Social Connections:   . Frequency of Communication with Friends and Family: Not on file  . Frequency of Social Gatherings with Friends and Family: Not on file  . Attends Religious Services: Not on file  . Active Member of Clubs or Organizations: Not on file  . Attends Banker Meetings: Not on file  . Marital Status: Not on file  Intimate Partner Violence:   . Fear of Current or Ex-Partner: Not on file  . Emotionally Abused: Not on file  . Physically Abused: Not on file  . Sexually Abused: Not on file   Family History  Problem Relation Age of Onset  . Diabetes Father   . Diabetes Maternal Grandmother    Review of Systems  Constitutional: Negative for fever and unexpected weight change.  HENT: Negative for congestion, dental problem, ear pain, nosebleeds, postnasal drip, rhinorrhea, sinus pressure, sneezing, sore throat and trouble swallowing.   Eyes: Negative for  redness and itching.  Respiratory: Negative for cough, chest tightness, shortness of breath and wheezing.   Cardiovascular: Negative for palpitations and leg swelling.  Gastrointestinal: Negative for nausea and vomiting.  Genitourinary: Negative for dysuria.  Musculoskeletal: Negative for joint swelling.  Skin: Negative for rash.  Allergic/Immunologic: Negative.  Negative for environmental allergies, food allergies and immunocompromised state.  Neurological: Negative for headaches.  Hematological: Does not bruise/bleed easily.  Psychiatric/Behavioral: Negative for dysphoric mood. The patient is not nervous/anxious.       Objective:   Physical Exam Constitutional:      Appearance: He is obese.  HENT:     Mouth/Throat:     Comments:  Crowded oropharynx, macroglossia, Mallampati 4 Eyes:     General:        Right eye: No discharge.        Left eye: No discharge.  Cardiovascular:     Rate and Rhythm: Normal rate and regular rhythm.     Pulses: Normal pulses.     Heart sounds: Normal heart sounds. No murmur heard.  No friction rub.  Pulmonary:     Effort: Pulmonary effort is normal. No respiratory distress.     Breath sounds: Normal breath sounds. No stridor. No wheezing or rhonchi.  Musculoskeletal:     Cervical back: No rigidity or tenderness.  Neurological:     Mental Status: He is alert.  Psychiatric:        Mood and Affect: Mood normal.    Vitals:   07/30/20 0857  BP: 118/74  Pulse: 95  Temp: (!) 97.5 F (36.4 C)  SpO2: 99%   Results of the Epworth flowsheet 11/19/2019  Sitting and reading 0  Watching TV 2  Sitting, inactive in a public place (e.g. a theatre or a meeting) 1  As a passenger in a car for an hour without a break 0  Lying down to rest in the afternoon when circumstances permit 2  Sitting and talking to someone 0  Sitting quietly after a lunch without alcohol 2  In a car, while stopped for a few minutes in traffic 0  Total score 7      .  Sleep study reviewed showing severe obstructive sleep apnea .  Titration study reviewed showing effective pressure of 17/13  Compliance data shows 100% compliance AHI of 0.8 Pressure settings of 17/13  Assessment & Plan:  .  Severe obstructive sleep apnea -Well-controlled with BiPAP 17/13 -Has concerns about pressures -Does have occasional leaks which is bothersome for his spouse  .Marland Kitchen Morbid obesity -He continues to work on weight loss efforts -Cut down portions, continues to work on weight loss  .  Daytime sleepiness -Much better with BiPAP -Feels he continues to benefit from BiPAP use  .  Daytime fatigue related to recent Covid infection -This continues to improve  Plan: Continue to use BiPAP on a nightly basis  Reduce pressure from  17/13-15/11  Encouraged to call us if pressures not tolerated  Encouraged about graded exercises to continue to work on weight loss efforts  Follow-up in 6 months

## 2020-07-30 NOTE — Patient Instructions (Signed)
Continue using CPAP on a regular basis  Continue weight loss efforts  We will contact your DME company to decrease your pressure from 17/13-15/ 11  As long as pressure is tolerated well-no changes need to be made We will try and get a download on you in a few weeks to ensure that still working effectively  Call us to readjust the pressure if it is not well-tolerated  I will follow-up with you in 6 months

## 2021-06-16 IMAGING — CT CT ANGIO CHEST
2 of 6 series · 18 of 46 positions shown · IV contrast (omnipaque)
Comparison: None.

CLINICAL DATA: Shortness of breath

EXAM:
CT ANGIOGRAPHY CHEST WITH CONTRAST
TECHNIQUE: Multidetector CT imaging of the chest was performed using the
standard protocol during bolus administration of intravenous
contrast. Multiplanar CT image reconstructions and MIPs were
obtained to evaluate the vascular anatomy.
CONTRAST:  100mL OMNIPAQUE IOHEXOL 350 MG/ML SOLN

[Series 5: pe axial thins · axial · 0.80mm/px · z∈[-278,-48]mm · 15 of 252 slices shown]
[im 11/252  lung]
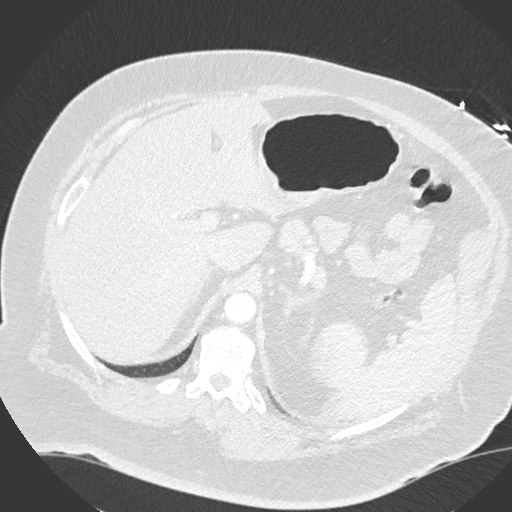
[im 33/252  soft-tissue]
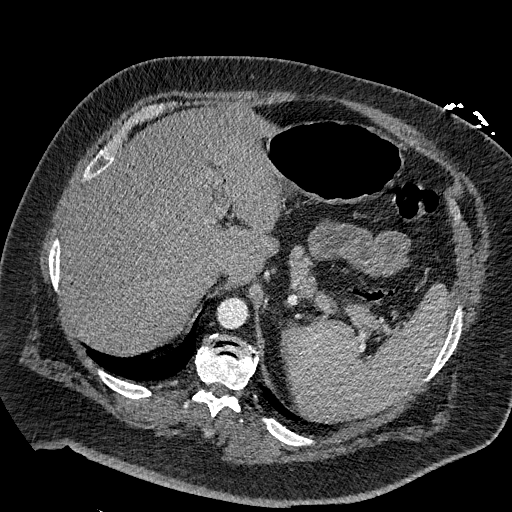
[im 44/252  lung]
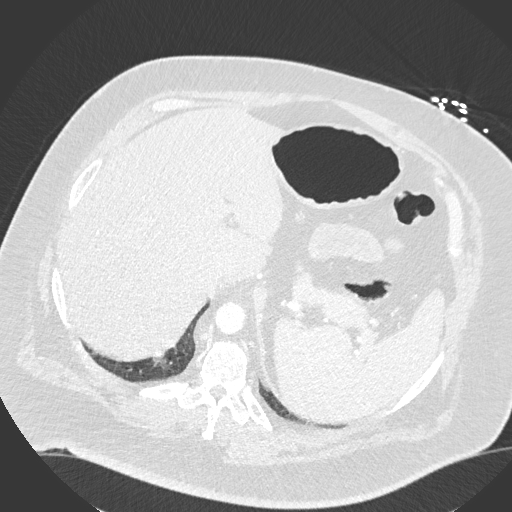
[im 66/252  soft-tissue]
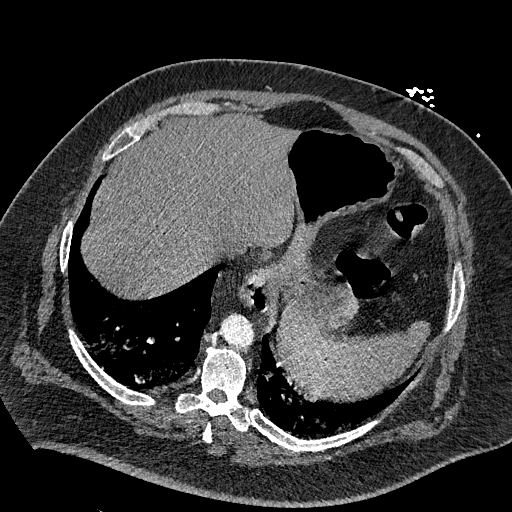
[im 77/252  lung]
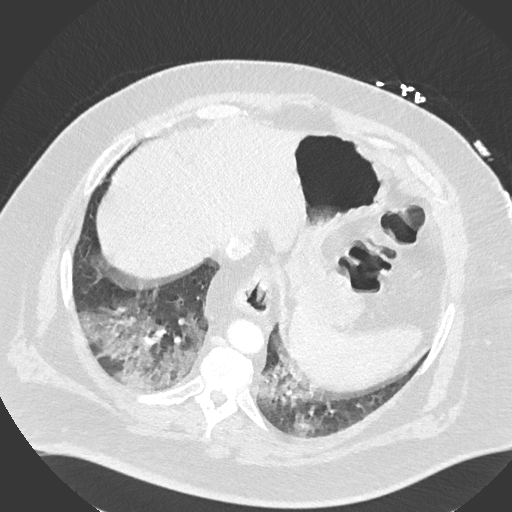
[im 99/252  soft-tissue]
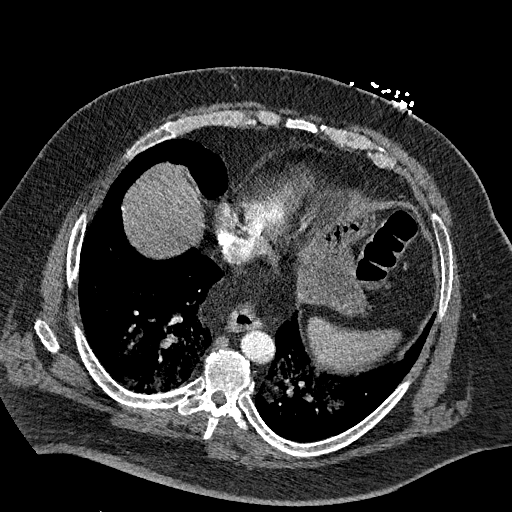
[im 110/252  lung]
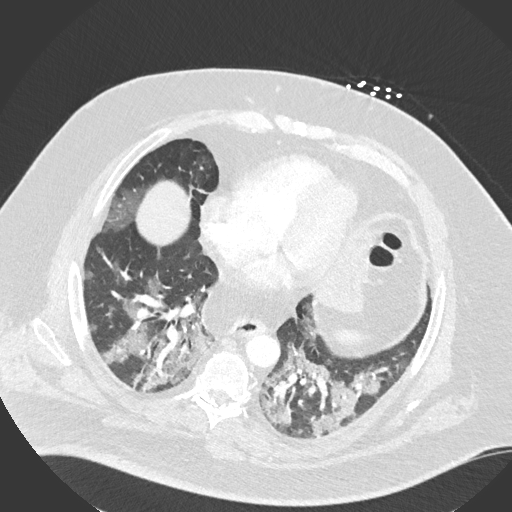
[im 131/252  soft-tissue]
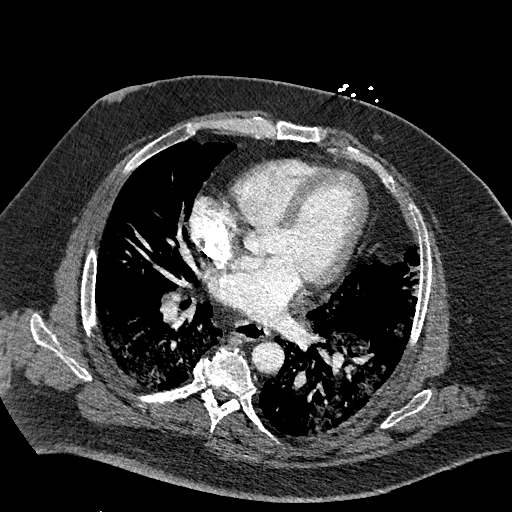
[im 142/252  lung]
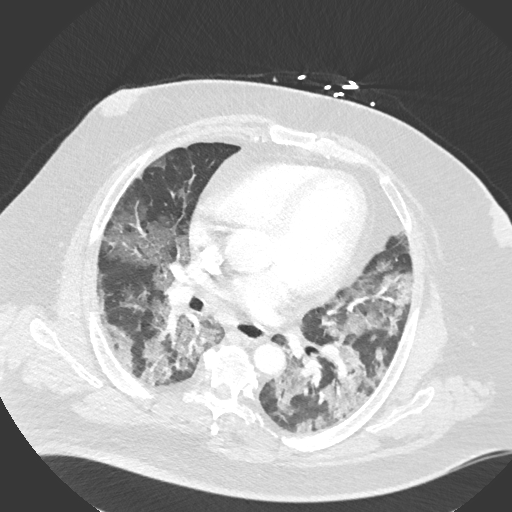
[im 153/252  soft-tissue]
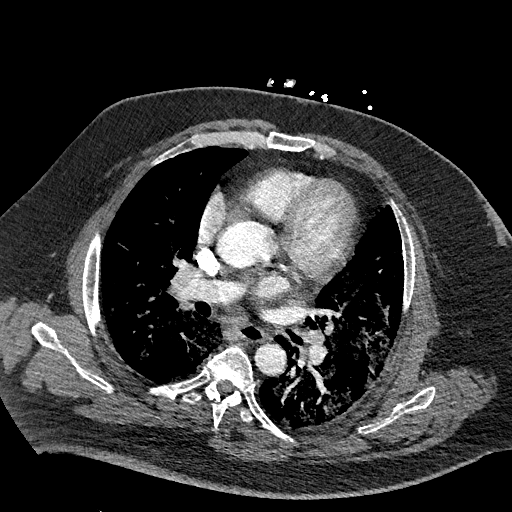
[im 175/252  lung]
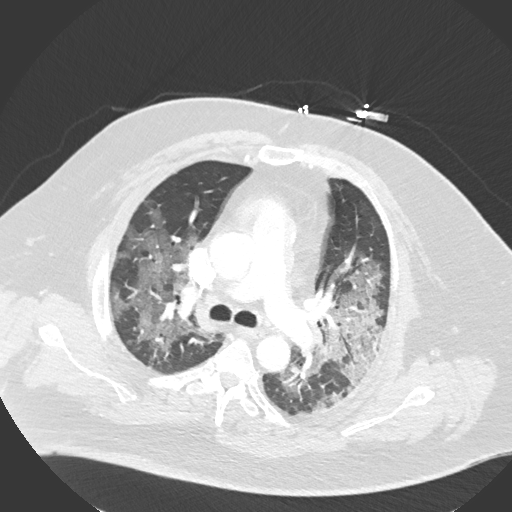
[im 186/252  soft-tissue]
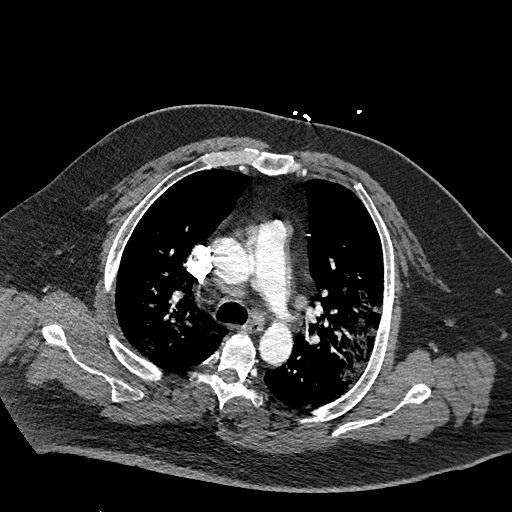
[im 208/252  lung]
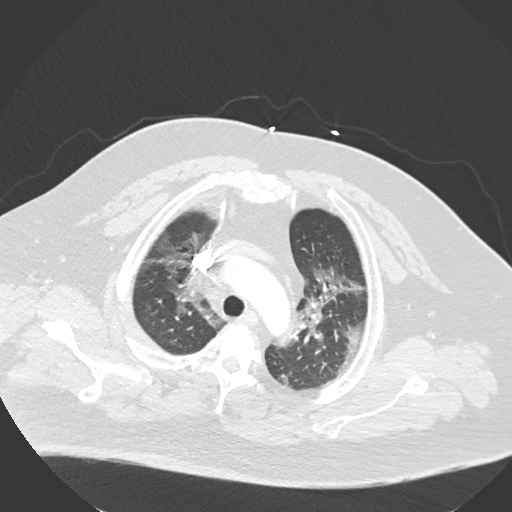
[im 219/252  soft-tissue]
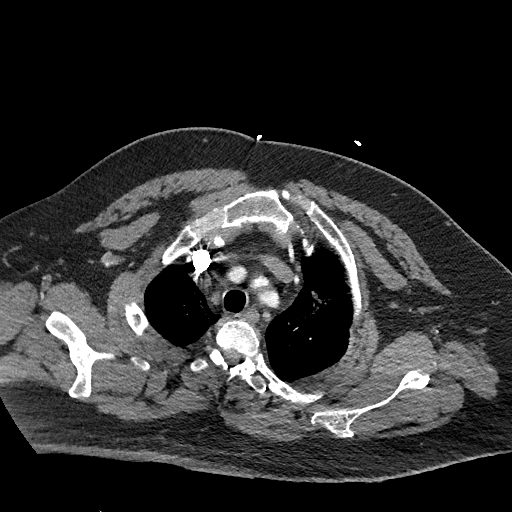
[im 241/252  lung]
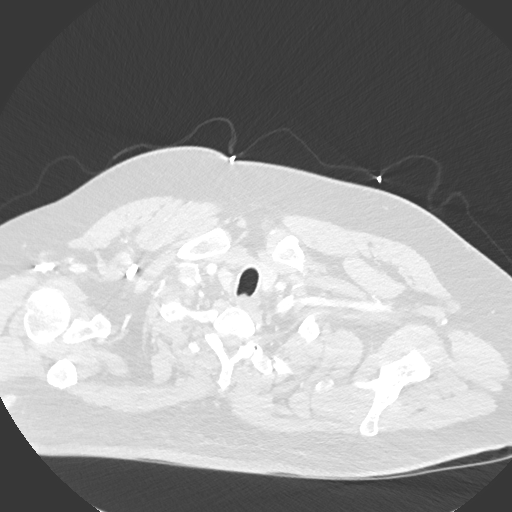

[Series 8: cor soft · coronal · 0.51mm/px · 3 of 151 slices shown]
[im 38/151  soft-tissue]
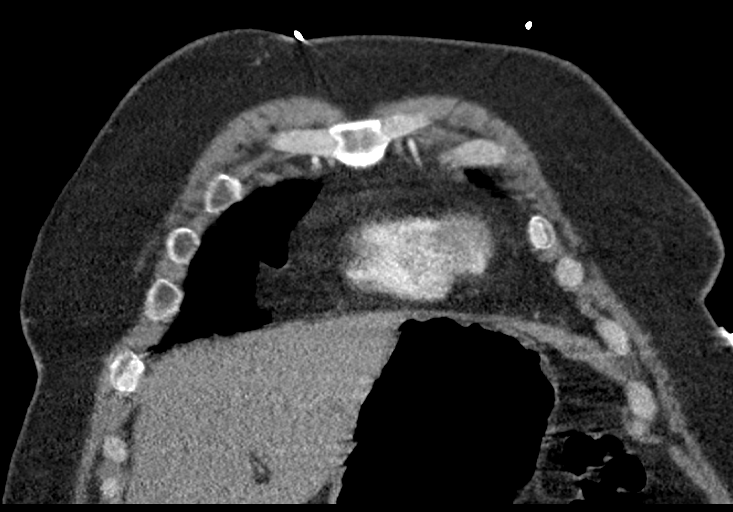
[im 76/151  soft-tissue]
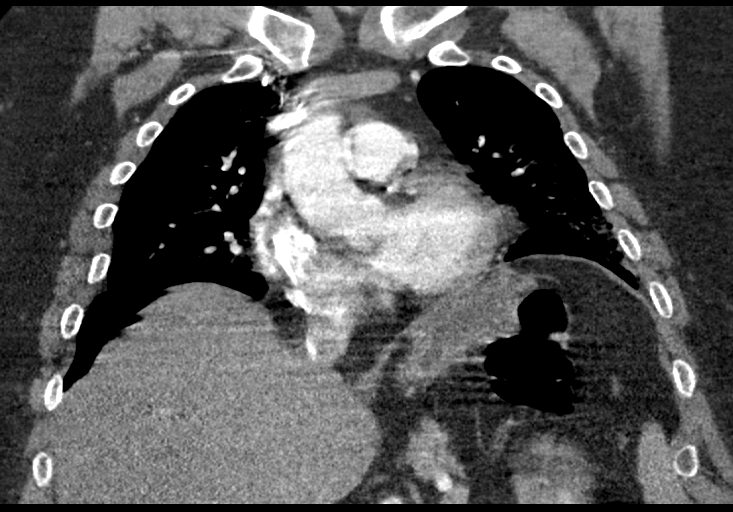
[im 113/151  soft-tissue]
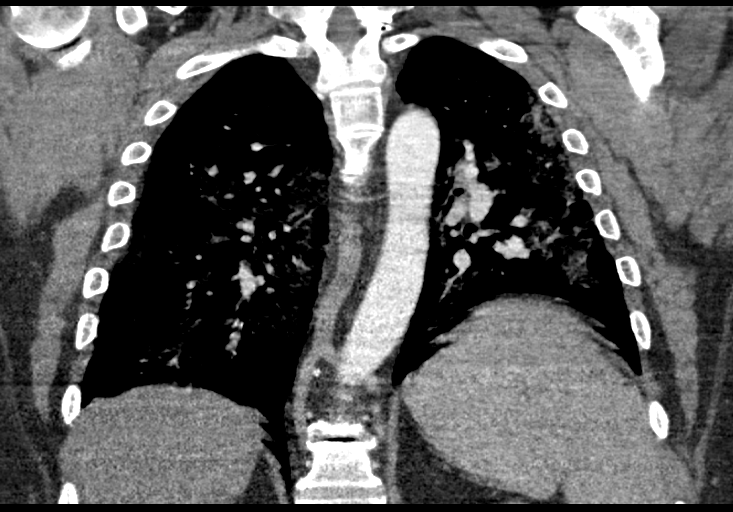

[18 of 46 positions shown; findings below may reference images not displayed]

FINDINGS: Cardiovascular: Evaluation for pulmonary emboli is limited by
significant respiratory motion artifact.Given this limitation, there
is no significant pulmonary embolism centrally. The main pulmonary
artery is within normal limits for size. There is no CT evidence of
acute right heart strain. The visualized aorta is normal. Heart size
is normal, without pericardial effusion.

Mediastinum/Nodes:

--No mediastinal or hilar lymphadenopathy.

--No axillary lymphadenopathy.

--No supraclavicular lymphadenopathy.

--Normal thyroid gland.

--The esophagus is unremarkable

Lungs/Pleura: There are diffuse bilateral ground-glass airspace
opacities. There is no pneumothorax or large pleural effusion. The
trachea is unremarkable.

Upper Abdomen: No acute abnormality.

Musculoskeletal: No chest wall abnormality. No acute or significant
osseous findings. Bilateral gynecomastia is noted.

Review of the MIP images confirms the above findings.
IMPRESSION: 1. Evaluation for pulmonary emboli is limited by significant
respiratory motion artifact. Given this limitation, there is no
significant pulmonary embolism centrally.
2. Diffuse bilateral ground-glass airspace opacities, consistent
with the patient's history of viral pneumonia.

## 2022-02-15 ENCOUNTER — Ambulatory Visit: Payer: Commercial Managed Care - PPO | Admitting: Pulmonary Disease

## 2022-02-24 ENCOUNTER — Ambulatory Visit (INDEPENDENT_AMBULATORY_CARE_PROVIDER_SITE_OTHER): Payer: Commercial Managed Care - PPO | Admitting: Pulmonary Disease

## 2022-02-24 ENCOUNTER — Encounter: Payer: Self-pay | Admitting: Pulmonary Disease

## 2022-02-24 VITALS — BP 124/80 | HR 88 | Temp 98.2°F | Ht 68.0 in | Wt 310.0 lb

## 2022-02-24 DIAGNOSIS — G4733 Obstructive sleep apnea (adult) (pediatric): Secondary | ICD-10-CM | POA: Diagnosis not present

## 2022-02-24 DIAGNOSIS — Z9989 Dependence on other enabling machines and devices: Secondary | ICD-10-CM

## 2022-02-24 NOTE — Patient Instructions (Signed)
I will see you back in a year  Continue using your BiPAP  Congrats about the weight loss journey  Call with significant concerns

## 2022-02-24 NOTE — Progress Notes (Signed)
Subjective:    Patient ID: Henry Miller, male    DOB: November 28, 1970, 51 y.o.   MRN: [id number]  Patient with a history of obstructive sleep apnea  Using BiPAP on a regular basis Feels the pressure may still be a little bit high Tolerating it well Continues to benefit from them  Feels generally better with BiPAP use  Decreased pressures from 17/13-15/11 Continues to tolerated well Has continued to lose weight gradually  Historically Admits to dryness of his mouth in the mornings No headaches Admits to sweating at night No nocturia No family history of obstructive sleep apnea  Works third shift takes him about 10 to 15 minutes to fall asleep 2-3 awakenings  Variability in his sleep and wake times depending on work schedule  Has managed to lose about 70-100 pounds lately-watching his diet and exercising regularly   Activity level generally continues to improve   Past Medical History:  Diagnosis Date   Pancreatitis    Type 2 diabetes mellitus (HCC)    Social History   Socioeconomic History   Marital status: Married    Spouse name: Not on file   Number of children: Not on file   Years of education: Not on file   Highest education level: Not on file  Occupational History   Not on file  Tobacco Use   Smoking status: Former    Packs/day: 1.00    Years: 5.00    Total pack years: 5.00    Types: Cigarettes    Quit date: 09/2005    Years since quitting: 16.4   Smokeless tobacco: Former    Types: Snuff  Substance and Sexual Activity   Alcohol use: No   Drug use: No   Sexual activity: Not on file  Other Topics Concern   Not on file  Social History Narrative   Not on file   Social Determinants of Health   Financial Resource Strain: Not on file  Food Insecurity: Not on file  Transportation Needs: Not on file  Physical Activity: Not on file  Stress: Not on file  Social Connections: Not on file  Intimate Partner Violence: Not on file   Family History   Problem Relation Age of Onset   Diabetes Father    Diabetes Maternal Grandmother    Review of Systems  Constitutional:  Negative for fever and unexpected weight change.  HENT:  Negative for congestion, dental problem, ear pain, nosebleeds, postnasal drip, rhinorrhea, sinus pressure, sneezing, sore throat and trouble swallowing.   Eyes:  Negative for redness and itching.  Respiratory:  Negative for cough, chest tightness, shortness of breath and wheezing.   Cardiovascular:  Negative for palpitations and leg swelling.  Gastrointestinal:  Negative for nausea and vomiting.  Genitourinary:  Negative for dysuria.  Musculoskeletal:  Negative for joint swelling.  Skin:  Negative for rash.  Allergic/Immunologic: Negative.  Negative for environmental allergies, food allergies and immunocompromised state.  Neurological:  Negative for headaches.  Hematological:  Does not bruise/bleed easily.  Psychiatric/Behavioral:  Negative for dysphoric mood. The patient is not nervous/anxious.       Objective:   Physical Exam Constitutional:      Appearance: He is obese.  HENT:     Head: Normocephalic.     Mouth/Throat:     Mouth: Mucous membranes are moist.     Comments: Crowded oropharynx, macroglossia, Mallampati 4 Eyes:     General:        Right eye: No discharge.  Left eye: No discharge.  Cardiovascular:     Rate and Rhythm: Normal rate and regular rhythm.     Pulses: Normal pulses.     Heart sounds: Normal heart sounds. No murmur heard.    No friction rub.  Pulmonary:     Effort: Pulmonary effort is normal. No respiratory distress.     Breath sounds: Normal breath sounds. No stridor. No wheezing or rhonchi.  Musculoskeletal:     Cervical back: No rigidity or tenderness.  Neurological:     Mental Status: He is alert.  Psychiatric:        Mood and Affect: Mood normal.    Vitals:   02/24/22 1119  BP: 124/80  Pulse: 88  Temp: 98.2 F (36.8 C)  SpO2: 98%      11/19/2019    11:00 AM  Results of the Epworth flowsheet  Sitting and reading 0  Watching TV 2  Sitting, inactive in a public place (e.g. a theatre or a meeting) 1  As a passenger in a car for an hour without a break 0  Lying down to rest in the afternoon when circumstances permit 2  Sitting and talking to someone 0  Sitting quietly after a lunch without alcohol 2  In a car, while stopped for a few minutes in traffic 0  Total score 7      .  Sleep study reviewed showing severe obstructive sleep apnea .  Titration study reviewed showing effective pressure of 17/13 .  Pressures decreased to 15/11 at last visit  Compliance data shows 100% compliance AHI of 0.8 Pressure settings of 17/13  Most recent compliance data 3/31 2023-02/23/2022 -100% compliance Residual AHI of 0.4 -Significant leaks -he has shaved his beard to achieve a better seal  Assessment & Plan:  .  Severe obstructive sleep apnea -Appears well controlled with BiPAP 15/11  .  Morbid obesity -Continues to work on weight loss efforts  .  Daytime sleepiness is much better  Plan: Continue to use BiPAP on a nightly basis  Continue pressure 15/11  Follow-up in a year

## 2022-02-24 NOTE — Addendum Note (Signed)
Addended by: Arvilla Market on: 02/24/2022 11:58 AM   Modules accepted: Orders

## 2024-09-05 ENCOUNTER — Other Ambulatory Visit: Payer: Self-pay

## 2024-09-05 NOTE — Telephone Encounter (Signed)
 Copied from CRM [id number]. Topic: Clinical - Medication Refill >> Sep 05, 2024  2:43 PM Hadassah PARAS wrote: Medication: TRULICITY 3 MG/0.5ML SOPN   Has the patient contacted their pharmacy? Yes (Agent: If no, request that the patient contact the pharmacy for the refill. If patient does not wish to contact the pharmacy document the reason why and proceed with request.) (Agent: If yes, when and what did the pharmacy advise?)  This is the patient's preferred pharmacy:  [facility] [address], [place] - [address] [place] [address] Phone: [phone] Fax: [phone]  Is this the correct pharmacy for this prescription? Yes If no, delete pharmacy and type the correct one.   Has the prescription been filled recently? Yes  Is the patient out of the medication? Yes  Has the patient been seen for an appointment in the last year OR does the patient have an upcoming appointment? Yes  Can we respond through MyChart? Yes  Agent: Please be advised that Rx refills may take up to 3 business days. We ask that you follow-up with your pharmacy.

## 2024-10-25 ENCOUNTER — Ambulatory Visit
